# Patient Record
Sex: Female | Born: 1955
Health system: Southern US, Community
[De-identification: ages and names within clinical notes are randomized; demographics above are authoritative.]

## PROBLEM LIST (undated history)

## (undated) DIAGNOSIS — E079 Disorder of thyroid, unspecified: Secondary | ICD-10-CM

## (undated) HISTORY — PX: APPENDECTOMY: SHX54

## (undated) HISTORY — DX: Disorder of thyroid, unspecified: E07.9

## (undated) HISTORY — PX: JOINT REPLACEMENT: SHX530

## (undated) HISTORY — PX: THYROIDECTOMY: SHX17

---

## 2018-07-22 HISTORY — PX: CERVICAL FUSION: SHX112

## 2018-07-22 HISTORY — PX: LUMBAR SPINE SURGERY: SHX701

## 2019-08-09 ENCOUNTER — Telehealth: Payer: Self-pay

## 2019-08-09 NOTE — Telephone Encounter (Signed)

## 2019-08-10 ENCOUNTER — Other Ambulatory Visit: Payer: Self-pay

## 2019-08-10 ENCOUNTER — Ambulatory Visit (INDEPENDENT_AMBULATORY_CARE_PROVIDER_SITE_OTHER): Payer: Medicare Other | Admitting: Internal Medicine

## 2019-08-10 ENCOUNTER — Encounter: Payer: Self-pay | Admitting: Internal Medicine

## 2019-08-10 VITALS — BP 116/74 | HR 51 | Temp 98.1°F | Resp 12 | Ht 60.0 in | Wt 123.0 lb

## 2019-08-10 DIAGNOSIS — Z23 Encounter for immunization: Secondary | ICD-10-CM

## 2019-08-10 DIAGNOSIS — E785 Hyperlipidemia, unspecified: Secondary | ICD-10-CM | POA: Insufficient documentation

## 2019-08-10 DIAGNOSIS — E039 Hypothyroidism, unspecified: Secondary | ICD-10-CM

## 2019-08-10 DIAGNOSIS — Z114 Encounter for screening for human immunodeficiency virus [HIV]: Secondary | ICD-10-CM

## 2019-08-10 DIAGNOSIS — Z1159 Encounter for screening for other viral diseases: Secondary | ICD-10-CM

## 2019-08-10 DIAGNOSIS — Z7689 Persons encountering health services in other specified circumstances: Secondary | ICD-10-CM

## 2019-08-10 NOTE — Patient Instructions (Signed)
Thank you for choosing Primary Care at [facility] to be your medical home!    Tina Bartlett was seen by De Hollingshead, DO today.   Tina Bartlett's primary care provider is Marcy Siren, DO.   For the best care possible, you should try to see Marcy Siren, DO whenever you come to the clinic.   We look forward to seeing you again soon!  If you have any questions about your visit today, please call us at [phone] or feel free to reach your primary care provider via MyChart.

## 2019-08-10 NOTE — Addendum Note (Signed)
Addended by: Heidi Dach on: 08/10/2019 10:44 AM   Modules accepted: Orders

## 2019-08-10 NOTE — Progress Notes (Signed)
  Subjective:    Tina Bartlett - 64 y.o. female MRN [id number]  Date of birth: [birth year]  HPI  Tina Bartlett is to establish care. Patient has a PMH significant for hypothyroidism and high cholesterol. Reports she has been stable on Synthroid 100 mcg since 2009. No fatigue, changes in hair/nails, hot/cold intolerance, or unintended weight changes.     Social History   reports that she has been smoking cigarettes. She has never used smokeless tobacco. She reports previous drug use.   Family History  family history includes Cancer in her mother.     Health Maintenance Due  Topic Date Due  . Hepatitis C Screening  1956/02/27  . HIV Screening  01/23/1972  . TETANUS/TDAP  01/23/1976  . PAP SMEAR-Modifier  01/22/1978     Review of Systems  Constitutional: Negative for chills, fever, malaise/fatigue and weight loss.  HENT: Negative for congestion, ear pain and sore throat.   Eyes: Negative for blurred vision, discharge and redness.  Respiratory: Negative for cough, shortness of breath and wheezing.   Cardiovascular: Negative for chest pain, palpitations and leg swelling.  Gastrointestinal: Negative for abdominal pain, constipation, diarrhea, nausea and vomiting.  Genitourinary: Negative for dysuria, frequency, hematuria and urgency.  Musculoskeletal: Negative for back pain and myalgias.  Skin: Negative for rash.  Neurological: Negative for dizziness, weakness and headaches.  Psychiatric/Behavioral: Negative for depression and suicidal ideas. The patient is not nervous/anxious.   All other systems reviewed and are negative.     Past Medical History: There are no problems to display for this patient.   Medications: reviewed and updated   Objective:   Physical Exam BP 116/74 (BP Location: Right Arm, Patient Position: Sitting, Cuff Size: Normal)   Pulse (!) 51   Temp 98.1 F (36.7 C) (Oral)   Resp 12   Ht 5' (1.524 m)   Wt 123 lb (55.8 kg)   BMI 24.02 kg/m  Physical  Exam  Constitutional: She is oriented to person, place, and time and well-developed, well-nourished, and in no distress. No distress.  HENT:  Head: Normocephalic and atraumatic.  Eyes: Conjunctivae and EOM are normal.  Cardiovascular: Normal rate, regular rhythm and normal heart sounds.  No murmur heard. Pulmonary/Chest: Effort normal and breath sounds normal. No respiratory distress.  Musculoskeletal:        General: Normal range of motion.  Neurological: She is alert and oriented to person, place, and time.  Skin: Skin is warm and dry. She is not diaphoretic.  Psychiatric: Affect and judgment normal.        Assessment & Plan:    1. Encounter to establish care  2. Hypothyroidism, unspecified type  Continue Synthroid 100 mcg. Adjust dose as needed based on TSH result.  - TSH  3. Hyperlipidemia, unspecified hyperlipidemia type Continue Simvastatin 40 mg.  - Lipid Panel  4. Need for hepatitis C screening test - Hepatitis C Antibody  5. Screening for HIV (human immunodeficiency virus) - HIV antibody (with reflex)     Marcy Siren, D.O. 08/10/2019, 9:29 AM Primary Care at [facility]

## 2019-08-10 NOTE — Progress Notes (Signed)
Recent surgery on back in September

## 2019-08-11 LAB — LIPID PANEL
Chol/HDL Ratio: 3 ratio (ref 0.0–4.4)
Cholesterol, Total: 172 mg/dL (ref 100–199)
HDL: 57 mg/dL (ref >39)
LDL Chol Calc (NIH): 94 mg/dL (ref 0–99)
Triglycerides: 121 mg/dL (ref 0–149)
VLDL Cholesterol Cal: 21 mg/dL (ref 5–40)

## 2019-08-11 LAB — HIV ANTIBODY (ROUTINE TESTING W REFLEX): HIV Screen 4th Generation wRfx: NONREACTIVE

## 2019-08-11 LAB — TSH: TSH: 0.757 u[IU]/mL (ref 0.450–4.500)

## 2019-08-11 LAB — HEPATITIS C ANTIBODY: Hep C Virus Ab: <0.1 s/co ratio (ref 0.0–0.9)

## 2019-08-16 ENCOUNTER — Telehealth: Payer: Self-pay

## 2019-08-16 ENCOUNTER — Other Ambulatory Visit: Payer: Self-pay | Admitting: Internal Medicine

## 2019-08-16 DIAGNOSIS — N951 Menopausal and female climacteric states: Secondary | ICD-10-CM

## 2019-08-16 MED ORDER — PAROXETINE MESYLATE 7.5 MG PO CAPS: 1.0000 | ORAL_CAPSULE | Freq: Every day | ORAL | 11 refills | Status: DC

## 2019-08-16 NOTE — Progress Notes (Signed)
I have prescribed a low dose of Paroxetine for her hot flashes. This is a medicine that is used for depression and anxiety in higher doses but has FDA approval to treat hot flashes. Given that her age is >6, I would recommend against any type of hormonal treatment given increased risk of heart disease, blood clots, and cancer.   Marcy Siren, D.O. Primary Care at [facility]  08/16/2019, 11:01 AM

## 2019-08-16 NOTE — Telephone Encounter (Signed)
Patient called in. She wants to know if she can be prescribed something for hot flashes. Says that she's been having them since she was 41 & initially going through menopause. She has tried OTC medications with no relief.  Premarin is listed on her medication list but she doesn't recall getting that medication from the pharmacy recently.

## 2019-08-16 NOTE — Progress Notes (Signed)
Patient notified of Rx being sent to pharmacy.

## 2019-08-16 NOTE — Progress Notes (Signed)
Patient notified of results & recommendations. Expressed understanding.

## 2019-08-24 ENCOUNTER — Other Ambulatory Visit: Payer: Self-pay | Admitting: Internal Medicine

## 2019-08-24 ENCOUNTER — Telehealth: Payer: Self-pay | Admitting: Internal Medicine

## 2019-08-24 DIAGNOSIS — N951 Menopausal and female climacteric states: Secondary | ICD-10-CM

## 2019-08-24 MED ORDER — PAROXETINE HCL 20 MG PO TABS: 20.0000 mg | ORAL_TABLET | Freq: Every day | ORAL | 3 refills | Status: DC

## 2019-08-24 NOTE — Progress Notes (Signed)
Patient notified of increased dose.

## 2019-08-24 NOTE — Telephone Encounter (Signed)
Please advise.  Patient has been on new Rx for about 1 week.

## 2019-08-24 NOTE — Telephone Encounter (Signed)
For Dr Earlene Plater: Patient called and said that her medication for hot flashes isn't doing anything. Would like to know if she can do something different.   Please contact patient in regards to medication management

## 2019-08-24 NOTE — Progress Notes (Signed)
I have sent in a higher dose of Paxil to see if that helps. Other options are trying a different SSRI/SNRI as sometimes they work differently for different people. If the higher dose does not help with her hot flashes she will need to make an appointment to discuss further. Again, since her age is >60 it is recommended that we do not use any type of hormonal treatment given increased risk of heart disease, blood clots, and cancer.   Marcy Siren, D.O. Primary Care at [facility]  08/24/2019, 10:52 AM

## 2019-08-30 ENCOUNTER — Other Ambulatory Visit: Payer: Self-pay | Admitting: Internal Medicine

## 2019-08-30 MED ORDER — LUBIPROSTONE 24 MCG PO CAPS: 24.0000 ug | ORAL_CAPSULE | Freq: Two times a day (BID) | ORAL | 3 refills | Status: DC

## 2019-08-30 NOTE — Telephone Encounter (Signed)
Would be initial Rx under your name.

## 2019-08-30 NOTE — Telephone Encounter (Signed)
Who previously prescribed Amitiza and for what indication? I don't see any records in EMR. Would need further information and potentially records prior to giving Rx.   Marcy Siren, D.O. Primary Care at [facility]  08/30/2019, 11:17 AM

## 2019-08-30 NOTE — Telephone Encounter (Signed)
Pt called asking for Amitiza 24 mg

## 2019-08-30 NOTE — Telephone Encounter (Signed)
Her previous PCP in [place] wrote it for chronic constipation.

## 2019-08-30 NOTE — Progress Notes (Signed)
Amitiza reordered.   Marcy Siren, D.O. Primary Care at [facility]  08/30/2019, 12:03 PM

## 2019-09-24 ENCOUNTER — Ambulatory Visit: Payer: Medicare Other | Attending: Internal Medicine

## 2019-09-24 DIAGNOSIS — Z23 Encounter for immunization: Secondary | ICD-10-CM

## 2019-09-24 NOTE — Progress Notes (Signed)
   Covid-19 Vaccination Clinic  Name:  Tina Bartlett    MRN: [id number] DOB: 1956/03/29  09/24/2019  Tina Bartlett was observed post Covid-19 immunization for 15 minutes without incident. She was provided with Vaccine Information Sheet and instruction to access the V-Safe system.   Tina Bartlett was instructed to call 911 with any severe reactions post vaccine: Marland Kitchen Difficulty breathing  . Swelling of face and throat  . A fast heartbeat  . A bad rash all over body  . Dizziness and weakness   Immunizations Administered    Name Date Dose VIS Date Route   Pfizer COVID-19 Vaccine 09/24/2019  1:15 PM 0.3 mL 07/02/2019 Intramuscular   Manufacturer: [employer], [employer]   Lot: [id number]   NDC: [address]-7

## 2019-10-04 ENCOUNTER — Other Ambulatory Visit: Payer: Self-pay | Admitting: Internal Medicine

## 2019-10-04 NOTE — Telephone Encounter (Signed)
1) Medication(s) Requested (by name): baclofen (LIORESAL) 20 MG tablet [[id number]]   2) Pharmacy of Choice:  [id number] - [place], [place] - [address]  [address], [place] [address]    Approved medications will be sent to pharmacy, we will reach out to you if there is an issue.  Requests made after 3pm may not be addressed until following business day!

## 2019-10-04 NOTE — Telephone Encounter (Signed)
Refill request.  Patient was on medication from previous PCP in [place].

## 2019-10-07 ENCOUNTER — Telehealth: Payer: Self-pay | Admitting: Internal Medicine

## 2019-10-07 MED ORDER — BACLOFEN 20 MG PO TABS: 20.0000 mg | ORAL_TABLET | Freq: Every day | ORAL | 0 refills | Status: DC

## 2019-10-07 NOTE — Telephone Encounter (Signed)
Baclofen has already been sent to Dr. Alvis Lemmings to prescribe. This is a new Rx that hasn't been written by one of our providers before so I am unable to refill.  Our providers do not write Xanax .

## 2019-10-07 NOTE — Telephone Encounter (Signed)
1) Medication(s) Requested (by name): baclofen (LIORESAL) 20 MG tablet [[id number]]  ALPRAZolam (XANAX) 1 MG tablet [[id number]]  2) Pharmacy of [facility]: [id number] [place], [place] - [address]  [address], [place] [address]     Approved medications will be sent to pharmacy, we will reach out to you if there is an issue.  Requests made after 3pm may not be addressed until following business day!

## 2019-10-12 NOTE — Telephone Encounter (Signed)
Patient called again asking about her Xanax. Patient was informed that her PCP does not prescribed that medication and patient stated that she needs the medication because of her anxiety. Patient would like to know if she can be prescribed something for her anxiety. Please f/u.

## 2019-10-13 ENCOUNTER — Other Ambulatory Visit: Payer: Self-pay | Admitting: Internal Medicine

## 2019-10-13 DIAGNOSIS — F419 Anxiety disorder, unspecified: Secondary | ICD-10-CM

## 2019-10-13 MED ORDER — HYDROXYZINE PAMOATE 25 MG PO CAPS: 25.0000 mg | ORAL_CAPSULE | Freq: Three times a day (TID) | ORAL | 0 refills | Status: DC | PRN

## 2019-10-13 NOTE — Telephone Encounter (Signed)
Please advise on patient's request for something in place of Xanax for her anxiety.

## 2019-10-13 NOTE — Progress Notes (Signed)
Please schedule phone visit to discuss patient's concerns with anxiety.

## 2019-10-13 NOTE — Progress Notes (Signed)
I have sent in hydroxyzine prn anxiety. Anxiety is not listed on patient's problem list and she did not bring this up during our visit to establish care. I would recommend that she has a visit set up to discuss this history and her symptoms related to anxiety.   Marcy Siren, D.O. Primary Care at [facility]  10/13/2019, 9:41 AM

## 2019-10-20 ENCOUNTER — Ambulatory Visit: Payer: Medicare Other | Attending: Internal Medicine

## 2019-10-20 DIAGNOSIS — Z23 Encounter for immunization: Secondary | ICD-10-CM

## 2019-10-20 NOTE — Progress Notes (Signed)
   Covid-19 Vaccination Clinic  Name:  Tina Bartlett    MRN: [id number] DOB: Oct 15, 1955  10/20/2019  [name] was observed post Covid-19 immunization for 15 minutes without incident. She was provided with Vaccine Information Sheet and instruction to access the V-Safe system.   [name] was instructed to call 911 with any severe reactions post vaccine: Marland Kitchen Difficulty breathing  . Swelling of face and throat  . A fast heartbeat  . A bad rash all over body  . Dizziness and weakness   Immunizations Administered    Name Date Dose VIS Date Route   Pfizer COVID-19 Vaccine 10/20/2019 11:34 AM 0.3 mL 07/02/2019 Intramuscular   Manufacturer: [employer], [employer]   Lot: [id number]   NDC: [address]-3

## 2019-11-11 ENCOUNTER — Other Ambulatory Visit: Payer: Self-pay | Admitting: Internal Medicine

## 2019-11-11 NOTE — Telephone Encounter (Signed)
1) Medication(s) Requested (by name):gabapentin (NEURONTIN) 600 MG tablet [[id number]]  baclofen (LIORESAL) 20 MG tablet [[id number]   2) [phone] [place], [place] - [address]  [address], [place] [address]   3) Special Requests:   Approved medications will be sent to the pharmacy, we will reach out if there is an issue.  Requests made after 3pm may not be addressed until the following business day!  If a patient is unsure of the name of the medication(s) please note and ask patient to call back when they are able to provide all info, do not send to responsible party until all information is available!

## 2019-11-15 MED ORDER — GABAPENTIN 600 MG PO TABS: 600.0000 mg | ORAL_TABLET | Freq: Three times a day (TID) | ORAL | 0 refills | Status: DC

## 2019-11-15 MED ORDER — BACLOFEN 20 MG PO TABS: 20.0000 mg | ORAL_TABLET | Freq: Every day | ORAL | 0 refills | Status: DC

## 2019-11-15 NOTE — Telephone Encounter (Signed)
1) Medication(s) Requested (by name):levothyroxine (SYNTHROID) 100 MCG tablet [[id number]]  baclofen (LIORESAL) 20 MG tablet [[id number] gabapentin (NEURONTIN) 600 MG tablet [[id number]]    2) [id number] [place], [place] - [address]  [address], [place] [address]   3) Special Requests:   Approved medications will be sent to the pharmacy, we will reach out if there is an issue.  Requests made after 3pm may not be addressed until the following business day!  If a patient is unsure of the name of the medication(s) please note and ask patient to call back when they are able to provide all info, do not send to responsible party until all information is available!

## 2019-11-23 ENCOUNTER — Other Ambulatory Visit: Payer: Self-pay

## 2019-11-23 DIAGNOSIS — N951 Menopausal and female climacteric states: Secondary | ICD-10-CM

## 2019-11-23 MED ORDER — SIMVASTATIN 40 MG PO TABS: 40.0000 mg | ORAL_TABLET | Freq: Every day | ORAL | 1 refills | Status: DC

## 2019-11-23 MED ORDER — PAROXETINE HCL 20 MG PO TABS: 20.0000 mg | ORAL_TABLET | Freq: Every day | ORAL | 1 refills | Status: DC

## 2019-11-23 MED ORDER — PANTOPRAZOLE SODIUM 40 MG PO TBEC: 40.0000 mg | DELAYED_RELEASE_TABLET | Freq: Every day | ORAL | 1 refills | Status: DC

## 2019-11-23 MED ORDER — LEVOTHYROXINE SODIUM 100 MCG PO TABS: 100.0000 ug | ORAL_TABLET | Freq: Every day | ORAL | 1 refills | Status: DC

## 2020-01-06 ENCOUNTER — Other Ambulatory Visit: Payer: Self-pay | Admitting: Internal Medicine

## 2020-01-06 NOTE — Telephone Encounter (Signed)
1) Medication(s) Requested (by name):gabapentin (NEURONTIN) 600 MG tablet [[id number]]  [id number] [place], [place] - [address]  [address], [place] [address]  2) Pharmacy of Choice:  3) Special Requests:   Approved medications will be sent to the pharmacy, we will reach out if there is an issue.  Requests made after 3pm may not be addressed until the following business day!  If a patient is unsure of the name of the medication(s) please note and ask patient to call back when they are able to provide all info, do not send to responsible party until all information is available!

## 2020-01-07 MED ORDER — GABAPENTIN 600 MG PO TABS: 600.0000 mg | ORAL_TABLET | Freq: Three times a day (TID) | ORAL | 2 refills | Status: DC

## 2020-01-07 MED ORDER — LUBIPROSTONE 24 MCG PO CAPS: 24.0000 ug | ORAL_CAPSULE | Freq: Two times a day (BID) | ORAL | 3 refills | Status: DC

## 2020-02-08 ENCOUNTER — Encounter: Payer: Self-pay | Admitting: Internal Medicine

## 2020-02-08 ENCOUNTER — Telehealth (INDEPENDENT_AMBULATORY_CARE_PROVIDER_SITE_OTHER): Payer: Medicare Other | Admitting: Internal Medicine

## 2020-02-08 DIAGNOSIS — Z13228 Encounter for screening for other metabolic disorders: Secondary | ICD-10-CM | POA: Diagnosis not present

## 2020-02-08 DIAGNOSIS — E785 Hyperlipidemia, unspecified: Secondary | ICD-10-CM

## 2020-02-08 DIAGNOSIS — E039 Hypothyroidism, unspecified: Secondary | ICD-10-CM

## 2020-02-08 DIAGNOSIS — G4700 Insomnia, unspecified: Secondary | ICD-10-CM

## 2020-02-08 MED ORDER — TRAZODONE HCL 50 MG PO TABS: 25.0000 mg | ORAL_TABLET | Freq: Every evening | ORAL | 3 refills | Status: DC | PRN

## 2020-02-08 NOTE — Progress Notes (Signed)
Virtual Visit via Telephone Note  I connected with Tina Bartlett, on 02/08/2020 at 10:33 AM by telephone due to the COVID-19 pandemic and verified that I am speaking with the correct person using two identifiers.   Consent: I discussed the limitations, risks, security and privacy concerns of performing an evaluation and management service by telephone and the availability of in person appointments. I also discussed with the patient that there may be a patient responsible charge related to this service. The patient expressed understanding and agreed to proceed.   Location of Patient: Home   Location of Provider: Clinic    Persons participating in Telemedicine visit: Kacelyn Mccuistion Charolotte Eke Dr. Earlene Plater   History of Present Illness: Patient has a visit to follow up on chronic medical conditions. Also has concerns about insomnia. Requests refill for Valium that she previously took to help her sleep. I have prescribed Hydroxyzine in the past for anxiety/sleep and she states this does not help.    Past Medical History:  Diagnosis Date  . Thyroid disease    No Known Allergies  Current Outpatient Medications on File Prior to Visit  Medication Sig Dispense Refill  . baclofen (LIORESAL) 20 MG tablet Take 1 tablet (20 mg total) by mouth daily. 30 each 0  . cholecalciferol (VITAMIN D3) 25 MCG (1000 UNIT) tablet Take 1,000 Units by mouth daily.    . diazepam (VALIUM) 5 MG tablet Take 5 mg by mouth every 6 (six) hours as needed for anxiety.    . gabapentin (NEURONTIN) 600 MG tablet Take 1 tablet (600 mg total) by mouth 3 (three) times daily. 90 tablet 2  . hydrOXYzine (VISTARIL) 25 MG capsule Take 1 capsule (25 mg total) by mouth 3 (three) times daily as needed for anxiety. 30 capsule 0  . levothyroxine (SYNTHROID) 100 MCG tablet Take 1 tablet (100 mcg total) by mouth daily. 90 tablet 1  . lubiprostone (AMITIZA) 24 MCG capsule Take 1 capsule (24 mcg total) by mouth 2 (two) times daily  with a meal. 60 capsule 3  . pantoprazole (PROTONIX) 40 MG tablet Take 1 tablet (40 mg total) by mouth daily. 90 tablet 1  . PARoxetine (PAXIL) 20 MG tablet Take 1 tablet (20 mg total) by mouth daily. 90 tablet 1  . PREMARIN 0.3 MG tablet Take 0.3 mg by mouth daily.    . simvastatin (ZOCOR) 40 MG tablet Take 1 tablet (40 mg total) by mouth daily. 90 tablet 1   No current facility-administered medications on file prior to visit.    Observations/Objective: NAD. Speaking clearly.  Work of breathing normal.  Alert and oriented. Mood appropriate.   Assessment and Plan: 1. Hypothyroidism, unspecified type No fatigue, changes in hair/nails, hot/cold intolerance, or unintended weight changes. Last TSH stable in Jan. Will repeat. Continue Synthroid 100 mcg.  - TSH+T4F+T3Free; Future  2. Hyperlipidemia, unspecified hyperlipidemia type Last LDL 94 in Jan 2021. Continue Simvastatin 40 mg.   3. Screening for metabolic disorder - CBC with Differential; Future - Comprehensive metabolic panel; Future  4. Insomnia, unspecified type Discussed that use of benzodiazepines as sleep aid is not indicated and that should not be a medication prescribed long term. Will attempt to control insomnia with Trazodone. Discussed sleep hygiene tactics.  - traZODone (DESYREL) 50 MG tablet; Take 0.5-1 tablets (25-50 mg total) by mouth at bedtime as needed for sleep.  Dispense: 30 tablet; Refill: 3   Follow Up Instructions: Lab visit    I discussed the assessment and treatment plan  with the patient. The patient was provided an opportunity to ask questions and all were answered. The patient agreed with the plan and demonstrated an understanding of the instructions.   The patient was advised to call back or seek an in-person evaluation if the symptoms worsen or if the condition fails to improve as anticipated.     I provided 18 minutes total of non-face-to-face time during this encounter including median  intraservice time, reviewing previous notes, investigations, ordering medications, medical decision making, coordinating care and patient verbalized understanding at the end of the visit.    Marcy Siren, D.O. Primary Care at [facility]  02/08/2020, 10:33 AM

## 2020-02-17 ENCOUNTER — Telehealth: Payer: Medicare Other | Admitting: Internal Medicine

## 2020-02-21 ENCOUNTER — Ambulatory Visit (INDEPENDENT_AMBULATORY_CARE_PROVIDER_SITE_OTHER): Payer: Medicare HMO | Admitting: Internal Medicine

## 2020-02-21 ENCOUNTER — Encounter: Payer: Self-pay | Admitting: Internal Medicine

## 2020-02-21 ENCOUNTER — Ambulatory Visit (INDEPENDENT_AMBULATORY_CARE_PROVIDER_SITE_OTHER): Payer: Medicare HMO

## 2020-02-21 VITALS — BP 125/82 | HR 61 | Temp 97.3°F | Resp 17 | Wt 130.0 lb

## 2020-02-21 DIAGNOSIS — Z13228 Encounter for screening for other metabolic disorders: Secondary | ICD-10-CM | POA: Diagnosis not present

## 2020-02-21 DIAGNOSIS — E039 Hypothyroidism, unspecified: Secondary | ICD-10-CM

## 2020-02-21 DIAGNOSIS — S4991XA Unspecified injury of right shoulder and upper arm, initial encounter: Secondary | ICD-10-CM | POA: Diagnosis not present

## 2020-02-21 DIAGNOSIS — W19XXXA Unspecified fall, initial encounter: Secondary | ICD-10-CM | POA: Diagnosis not present

## 2020-02-21 DIAGNOSIS — M25511 Pain in right shoulder: Secondary | ICD-10-CM

## 2020-02-21 DIAGNOSIS — M19011 Primary osteoarthritis, right shoulder: Secondary | ICD-10-CM | POA: Diagnosis not present

## 2020-02-21 MED ORDER — LIDOCAINE 5 % EX PTCH: 1.0000 | MEDICATED_PATCH | CUTANEOUS | 1 refills | Status: DC

## 2020-02-21 NOTE — Progress Notes (Signed)
  Subjective:    Tina Bartlett - 64 y.o. female MRN [id number]  Date of birth: 05-Apr-1956  HPI  Tina Bartlett is here for visit for a recent fall. Having right shoulder pain since falling letting her dog out. Larey Seat a little less than a week ago. Has an ache inside her shoulder. She had some bruising after the initial injury but it has since resolved. Asks for lidocaine patch---she used to use these in [place] for pain control. She found one at home and it helped relieve her pain a lot.   Health Maintenance:  Health Maintenance Due  Topic Date Due  . TETANUS/TDAP  Never done  . PAP SMEAR-Modifier  Never done  . INFLUENZA VACCINE  02/20/2020    -  reports that she has been smoking cigarettes. She has never used smokeless tobacco. - Review of Systems: Per HPI. - Past Medical History: Patient Active Problem List   Diagnosis Date Noted  . Vasomotor symptoms due to menopause 08/16/2019  . Hypothyroidism 08/10/2019  . Hyperlipidemia 08/10/2019   - Medications: reviewed and updated   Objective:   Physical Exam BP 125/82   Pulse 61   Temp (!) 97.3 F (36.3 C) (Temporal)   Resp 17   Wt 130 lb (59 kg)   SpO2 96%   BMI 25.39 kg/m  Physical Exam Constitutional:      General: She is not in acute distress.    Appearance: She is not diaphoretic.  Cardiovascular:     Rate and Rhythm: Normal rate.  Pulmonary:     Effort: Pulmonary effort is normal. No respiratory distress.  Musculoskeletal:     Comments: Right shoulder without bruising, edema, erythema. No obvious joint abnormality. Has some initial limited active ROM in abduction past about 150 degrees but this improves with movement. Passive ROM intact. Pain with internal rotation.   Skin:    General: Skin is warm and dry.  Neurological:     Mental Status: She is alert and oriented to person, place, and time.  Psychiatric:        Mood and Affect: Affect normal.        Judgment: Judgment normal.            Assessment & Plan:    1. Fall, initial encounter  2. Acute pain of right shoulder Will obtain X-ray to ensure no abnormality related to fall. Have prescribed Lidocaine patch for pain relief and instructed on how to use. Given gentle ROM shoulder exercises to start at home. Continue with ice and heat therapy. Return if not improving.  - lidocaine (LIDODERM) 5 %; Place 1 patch onto the skin daily. Remove & Discard patch within 12 hours or as directed by MD  Dispense: 30 patch; Refill: 1 - DG Shoulder Right; Future  3. Hypothyroidism, unspecified type - TSH+T4F+T3Free  4. Screening for metabolic disorder - Comprehensive metabolic panel - CBC with Differential      Marcy Siren, D.O. 02/21/2020, 1:51 PM Primary Care at [facility]

## 2020-02-21 NOTE — Patient Instructions (Signed)
Shoulder Range of Motion Exercises Shoulder range of motion (ROM) exercises are done to keep the shoulder moving freely or to increase movement. They are often recommended for people who have shoulder pain or stiffness or who are recovering from a shoulder surgery. Phase 1 exercises When you are able, do this exercise 1-2 times per day for 30-60 seconds in each direction, or as directed by your health care provider. Pendulum exercise To do this exercise while sitting: 1. Sit in a chair or at the edge of your bed with your feet flat on the floor. 2. Let your affected arm hang down in front of you over the edge of the bed or chair. 3. Relax your shoulder, arm, and hand. 4. Rock your body so your arm gently swings in small circles. You can also use your unaffected arm to start the motion. 5. Repeat changing the direction of the circles, swinging your arm left and right, and swinging your arm forward and back. To do this exercise while standing: 1. Stand next to a sturdy chair or table, and hold on to it with your hand on your unaffected side. 2. Bend forward at the waist. 3. Bend your knees slightly. 4. Relax your shoulder, arm, and hand. 5. While keeping your shoulder relaxed, use body motion to swing your arm in small circles. 6. Repeat changing the direction of the circles, swinging your arm left and right, and swinging your arm forward and back. 7. Between exercises, stand up tall and take a short break to relax your lower back.  Phase 2 exercises Do these exercises 1-2 times per day or as told by your health care provider. Hold each stretch for 30 seconds, and repeat 3 times. Do the exercises with one or both arms as instructed by your health care provider. For these exercises, sit at a table with your hand and arm supported by the table. A chair that slides easily or has wheels can be helpful. External rotation 1. Turn your chair so that your affected side is nearest to the  table. 2. Place your forearm on the table to your side. Bend your elbow about 90 at the elbow (right angle) and place your hand palm facing down on the table. Your elbow should be about 6 inches away from your side. 3. Keeping your arm on the table, lean your body forward. Abduction 1. Turn your chair so that your affected side is nearest to the table. 2. Place your forearm and hand on the table so that your thumb points toward the ceiling and your arm is straight out to your side. 3. Slide your hand out to the side and away from you, using your unaffected arm to do the work. 4. To increase the stretch, you can slide your chair away from the table. Flexion: forward stretch 1. Sit facing the table. Place your hand and elbow on the table in front of you. 2. Slide your hand forward and away from you, using your unaffected arm to do the work. 3. To increase the stretch, you can slide your chair backward. Phase 3 exercises Do these exercises 1-2 times per day or as told by your health care provider. Hold each stretch for 30 seconds, and repeat 3 times. Do the exercises with one or both arms as instructed by your health care provider. Cross-body stretch: posterior capsule stretch 1. Lift your arm straight out in front of you. 2. Bend your arm 90 at the elbow (right angle) so your forearm   moves across your body. 3. Use your other arm to gently pull the elbow across your body, toward your other shoulder. Wall climbs 1. Stand with your affected arm extended out to the side with your hand resting on a door frame. 2. Slide your hand slowly up the door frame. 3. To increase the stretch, step through the door frame. Keep your body upright and do not lean. Wand exercises You will need a cane, a piece of PVC pipe, or a sturdy wooden dowel for wand exercises. Flexion To do this exercise while standing: 1. Hold the wand with both of your hands, palms down. 2. Using the other arm to help, lift your arms  up and over your head, if able. 3. Push upward with your other arm to gently increase the stretch. To do this exercise while lying down: 1. Lie on your back with your elbows resting on the floor and the wand in both your hands. Your hands will be palm down, or pointing toward your feet. 2. Lift your hands toward the ceiling, using your unaffected arm to help if needed. 3. Bring your arms overhead as able, using your unaffected arm to help if needed. Internal rotation 1. Stand while holding the wand behind you with both hands. Your unaffected arm should be extended above your head with the arm of the affected side extended behind you at the level of your waist. The wand should be pointing straight up and down as you hold it. 2. Slowly pull the wand up behind your back by straightening the elbow of your unaffected arm and bending the elbow of your affected arm. External rotation 1. Lie on your back with your affected upper arm supported on a small pillow or rolled towel. When you first do this exercise, keep your upper arm close to your body. Over time, bring your arm up to a 90 angle out to the side. 2. Hold the wand across your stomach and with both hands palm up. Your elbow on your affected side should be bent at a 90 angle. 3. Use your unaffected side to help push your forearm away from you and toward the floor. Keep your elbow on your affected side bent at a 90 angle. Contact a health care provider if you have:  New or increasing pain.  New numbness, tingling, weakness, or discoloration in your arm or hand. This information is not intended to replace advice given to you by your health care provider. Make sure you discuss any questions you have with your health care provider. Document Revised: 08/20/2017 Document Reviewed: 08/20/2017 Elsevier Patient Education  2020 [employer]  

## 2020-02-22 ENCOUNTER — Other Ambulatory Visit: Payer: Self-pay | Admitting: Internal Medicine

## 2020-02-22 ENCOUNTER — Telehealth: Payer: Self-pay | Admitting: Internal Medicine

## 2020-02-22 DIAGNOSIS — R7989 Other specified abnormal findings of blood chemistry: Secondary | ICD-10-CM

## 2020-02-22 LAB — CBC WITH DIFFERENTIAL/PLATELET
Basophils Absolute: 0 10*3/uL (ref 0.0–0.2)
Basos: 1 %
EOS (ABSOLUTE): 0 10*3/uL (ref 0.0–0.4)
Eos: 1 %
Hematocrit: 40.2 % (ref 34.0–46.6)
Hemoglobin: 13.2 g/dL (ref 11.1–15.9)
Immature Grans (Abs): 0 10*3/uL (ref 0.0–0.1)
Immature Granulocytes: 0 %
Lymphocytes Absolute: 2.6 10*3/uL (ref 0.7–3.1)
Lymphs: 44 %
MCH: 30 pg (ref 26.6–33.0)
MCHC: 32.8 g/dL (ref 31.5–35.7)
MCV: 91 fL (ref 79–97)
Monocytes Absolute: 0.5 10*3/uL (ref 0.1–0.9)
Monocytes: 9 %
Neutrophils Absolute: 2.6 10*3/uL (ref 1.4–7.0)
Neutrophils: 45 %
Platelets: 256 10*3/uL (ref 150–450)
RBC: 4.4 x10E6/uL (ref 3.77–5.28)
RDW: 12.3 % (ref 11.7–15.4)
WBC: 5.8 10*3/uL (ref 3.4–10.8)

## 2020-02-22 LAB — COMPREHENSIVE METABOLIC PANEL
ALT: 15 IU/L (ref 0–32)
AST: 23 IU/L (ref 0–40)
Albumin/Globulin Ratio: 2.1 (ref 1.2–2.2)
Albumin: 5.1 g/dL — ABNORMAL HIGH (ref 3.8–4.8)
Alkaline Phosphatase: 92 IU/L (ref 48–121)
BUN/Creatinine Ratio: 12 (ref 12–28)
BUN: 14 mg/dL (ref 8–27)
Bilirubin Total: 0.3 mg/dL (ref 0.0–1.2)
CO2: 24 mmol/L (ref 20–29)
Calcium: 9.9 mg/dL (ref 8.7–10.3)
Chloride: 102 mmol/L (ref 96–106)
Creatinine, Ser: 1.16 mg/dL — ABNORMAL HIGH (ref 0.57–1.00)
GFR calc Af Amer: 58 mL/min/{1.73_m2} — ABNORMAL LOW (ref >59)
GFR calc non Af Amer: 50 mL/min/{1.73_m2} — ABNORMAL LOW (ref >59)
Globulin, Total: 2.4 g/dL (ref 1.5–4.5)
Glucose: 96 mg/dL (ref 65–99)
Potassium: 4.7 mmol/L (ref 3.5–5.2)
Sodium: 143 mmol/L (ref 134–144)
Total Protein: 7.5 g/dL (ref 6.0–8.5)

## 2020-02-22 LAB — TSH+T4F+T3FREE
Free T4: 1.44 ng/dL (ref 0.82–1.77)
T3, Free: 2.5 pg/mL (ref 2.0–4.4)
TSH: 3.11 u[IU]/mL (ref 0.450–4.500)

## 2020-02-22 NOTE — Telephone Encounter (Signed)
Patient called in to inform pcp/nurse that she is needing a prior auth per insurance for listed medication. Please follow up at your earliest convenience.  lidocaine (LIDODERM) 5 % [[id number]]

## 2020-02-23 NOTE — Telephone Encounter (Signed)
Patient called in to check on the status of her patches. Please follow up at your earliest convenience.

## 2020-02-24 NOTE — Telephone Encounter (Signed)
PA was denied. Is there an alternative medication you can send?

## 2020-02-24 NOTE — Telephone Encounter (Signed)
Logged onto [email or web address] to check status of prior authorization. It is still awaiting determination from patient's insurance.

## 2020-02-25 ENCOUNTER — Other Ambulatory Visit: Payer: Self-pay | Admitting: Internal Medicine

## 2020-02-25 MED ORDER — LIDOCAINE 5 % EX OINT: 1.0000 "application " | TOPICAL_OINTMENT | CUTANEOUS | 0 refills | Status: DC | PRN

## 2020-02-25 NOTE — Telephone Encounter (Signed)
I just sent in the ointment version of the same medication. We can see if that gets approval over the patch.   .si

## 2020-02-28 ENCOUNTER — Other Ambulatory Visit: Payer: Medicare Other

## 2020-02-28 NOTE — Telephone Encounter (Signed)
1) Medication(s) Requested (by name):baclofen (LIORESAL) 20 MG tablet [[id number]]    2) [phone] [place], [place] - [address]  [address], [place] [address]   3) Special Requests:   Approved medications will be sent to the pharmacy, we will reach out if there is an issue.  Requests made after 3pm may not be addressed until the following business day!  If a patient is unsure of the name of the medication(s) please note and ask patient to call back when they are able to provide all info, do not send to responsible party until all information is available!

## 2020-03-01 ENCOUNTER — Other Ambulatory Visit: Payer: Self-pay

## 2020-03-01 DIAGNOSIS — N951 Menopausal and female climacteric states: Secondary | ICD-10-CM

## 2020-03-01 MED ORDER — LUBIPROSTONE 24 MCG PO CAPS: 24.0000 ug | ORAL_CAPSULE | Freq: Two times a day (BID) | ORAL | 0 refills | Status: DC

## 2020-03-01 MED ORDER — PAROXETINE HCL 20 MG PO TABS: 20.0000 mg | ORAL_TABLET | Freq: Every day | ORAL | 0 refills | Status: DC

## 2020-03-01 MED ORDER — PANTOPRAZOLE SODIUM 40 MG PO TBEC: 40.0000 mg | DELAYED_RELEASE_TABLET | Freq: Every day | ORAL | 0 refills | Status: DC

## 2020-03-03 NOTE — Progress Notes (Signed)
Patient notified of results & recommendations. Expressed understanding.

## 2020-03-13 ENCOUNTER — Telehealth: Payer: Self-pay | Admitting: Internal Medicine

## 2020-03-13 NOTE — Telephone Encounter (Signed)
Patient called in and stated that she received a vm from the office with the rep asking a lot of questions. The patient also wanted to ask pcp/nurse why she could get any baclofen (LIORESAL) 20 MG tablet [[id number]]. Patient stated that she has been experiencing shoulder pain and wanted something to help with the pain.

## 2020-03-14 ENCOUNTER — Other Ambulatory Visit: Payer: Self-pay

## 2020-03-14 MED ORDER — BACLOFEN 20 MG PO TABS: 20.0000 mg | ORAL_TABLET | Freq: Every day | ORAL | 2 refills | Status: DC

## 2020-03-14 NOTE — Telephone Encounter (Signed)
Sent refill request to provider.  Left voicemail to get more information about the rep who left her a VM.

## 2020-03-21 ENCOUNTER — Telehealth (INDEPENDENT_AMBULATORY_CARE_PROVIDER_SITE_OTHER): Payer: Medicare HMO | Admitting: Internal Medicine

## 2020-03-21 ENCOUNTER — Encounter: Payer: Self-pay | Admitting: Internal Medicine

## 2020-03-21 DIAGNOSIS — M25511 Pain in right shoulder: Secondary | ICD-10-CM | POA: Diagnosis not present

## 2020-03-21 DIAGNOSIS — G4700 Insomnia, unspecified: Secondary | ICD-10-CM

## 2020-03-21 NOTE — Progress Notes (Signed)
Virtual Visit via Telephone Note  I connected with Tina Bartlett, on 03/21/2020 at 2:56 PM by telephone due to the COVID-19 pandemic and verified that I am speaking with the correct person using two identifiers.   Consent: I discussed the limitations, risks, security and privacy concerns of performing an evaluation and management service by telephone and the availability of in person appointments. I also discussed with the patient that there may be a patient responsible charge related to this service. The patient expressed understanding and agreed to proceed.   Location of Patient: Home   Location of Provider: Home    Persons participating in Telemedicine visit: Tina Bartlett Tina Bartlett Dr. Earlene Plater    History of Present Illness:  Patient has a follow up for chronic medical conditions. Reports that the Trazodone caused side effects. Initially she said that it caused hallucinations. From further discussion, sounds like it was actually vivid dreams. Wants to know why she can't take Xanax 1 mg for help with sleep aid.   Is also having worsening of her right shoulder pain. This was exacerbated by a recent fall. An xray was obtained and showed degenerative changes. Patient reports that muscle relaxer and gabapentin are not helping. When bringing up possibility of steroid injection, she asked about a steroid dose pack. Currently working on getting Lidocaine patches approved by insurance.    Past Medical History:  Diagnosis Date  . Thyroid disease    No Known Allergies  Current Outpatient Medications on File Prior to Visit  Medication Sig Dispense Refill  . baclofen (LIORESAL) 20 MG tablet Take 1 tablet (20 mg total) by mouth daily. 30 each 2  . cholecalciferol (VITAMIN D3) 25 MCG (1000 UNIT) tablet Take 1,000 Units by mouth daily.    Marland Kitchen gabapentin (NEURONTIN) 600 MG tablet Take 1 tablet (600 mg total) by mouth 3 (three) times daily. 90 tablet 2  . hydrOXYzine (VISTARIL) 25 MG  capsule Take 1 capsule (25 mg total) by mouth 3 (three) times daily as needed for anxiety. 30 capsule 0  . levothyroxine (SYNTHROID) 100 MCG tablet Take 1 tablet (100 mcg total) by mouth daily. 90 tablet 1  . lidocaine (XYLOCAINE) 5 % ointment Apply 1 application topically as needed. 35.44 g 0  . lubiprostone (AMITIZA) 24 MCG capsule Take 1 capsule (24 mcg total) by mouth 2 (two) times daily with a meal. 180 capsule 0  . pantoprazole (PROTONIX) 40 MG tablet Take 1 tablet (40 mg total) by mouth daily. 90 tablet 0  . PARoxetine (PAXIL) 20 MG tablet Take 1 tablet (20 mg total) by mouth daily. 90 tablet 0  . PREMARIN 0.3 MG tablet Take 0.3 mg by mouth daily.    . simvastatin (ZOCOR) 40 MG tablet Take 1 tablet (40 mg total) by mouth daily. 90 tablet 1  . traZODone (DESYREL) 50 MG tablet Take 0.5-1 tablets (25-50 mg total) by mouth at bedtime as needed for sleep. 30 tablet 3   No current facility-administered medications on file prior to visit.    Observations/Objective: NAD. Speaking clearly.  Work of breathing normal.  Alert and oriented. Mood appropriate.   Assessment and Plan: 1. Insomnia, unspecified type Patient discontinued Trazodone due to side effects. At first, was concerned for serotonin syndrome with report of hallucinations especially given use with Paxil. However, seems that she was experiencing vivid dreams instead of true hallucinations. Will continue to manage with sleep hygiene tactics for now.   2. Right shoulder pain, unspecified chronicity Xray showed mild AC  joint degenerative change and mild glenohumeral degenerative change. Lidocaine approval for pain management is pending. We discussed that joint injection may be beneficial and would like to trial that instead of systemic steroids due to risk of using PO steroids. Patient agreeable for [facility] referral for evaluation for injection.  - Ambulatory referral to Sports Medicine   Follow Up Instructions: PRN and for routine  medical care    I discussed the assessment and treatment plan with the patient. The patient was provided an opportunity to ask questions and all were answered. The patient agreed with the plan and demonstrated an understanding of the instructions.   The patient was advised to call back or seek an in-person evaluation if the symptoms worsen or if the condition fails to improve as anticipated.     I provided 22 minutes total of non-face-to-face time during this encounter including median intraservice time, reviewing previous notes, investigations, ordering medications, medical decision making, coordinating care and patient verbalized understanding at the end of the visit.    Marcy Siren, D.O. Primary Care at [facility]  03/21/2020, 2:56 PM

## 2020-04-24 ENCOUNTER — Encounter: Payer: Self-pay | Admitting: Physical Medicine and Rehabilitation

## 2020-04-24 ENCOUNTER — Ambulatory Visit: Payer: Self-pay

## 2020-04-24 ENCOUNTER — Other Ambulatory Visit: Payer: Self-pay

## 2020-04-24 ENCOUNTER — Ambulatory Visit (INDEPENDENT_AMBULATORY_CARE_PROVIDER_SITE_OTHER): Payer: Medicare HMO | Admitting: Physical Medicine and Rehabilitation

## 2020-04-24 DIAGNOSIS — G8929 Other chronic pain: Secondary | ICD-10-CM

## 2020-04-24 DIAGNOSIS — M25511 Pain in right shoulder: Secondary | ICD-10-CM

## 2020-04-24 DIAGNOSIS — Z981 Arthrodesis status: Secondary | ICD-10-CM | POA: Diagnosis not present

## 2020-04-24 DIAGNOSIS — M5412 Radiculopathy, cervical region: Secondary | ICD-10-CM | POA: Diagnosis not present

## 2020-04-24 DIAGNOSIS — M961 Postlaminectomy syndrome, not elsewhere classified: Secondary | ICD-10-CM

## 2020-04-24 MED ORDER — TRIAMCINOLONE ACETONIDE 40 MG/ML IJ SUSP
60.0000 mg | INTRAMUSCULAR | Status: AC | PRN
  Administered 2020-04-24: 60 mg via INTRA_ARTICULAR

## 2020-04-24 MED ORDER — BUPIVACAINE HCL 0.5 % IJ SOLN
3.0000 mL | INTRAMUSCULAR | Status: AC | PRN
  Administered 2020-04-24: 3 mL via INTRA_ARTICULAR

## 2020-04-24 NOTE — Progress Notes (Signed)
Tina Bartlett - 64 y.o. female MRN [id number]  Date of birth: Dec 31, 1955  Office Visit Note: Visit Date: 04/24/2020 PCP: Arvilla Market, DO Referred by: Leary Roca*  Subjective: Chief Complaint  Patient presents with  . Right Shoulder - Pain   HPI: Tina Bartlett is a 65 y.o. female who comes in today For new patient evaluation and management of worsening severe right shoulder and arm pain at the request of Dr. Marcy Siren.  The patient moved from [place] to the area prior to January of this year.  She established care with Dr. Earlene Plater.  In August she reports having had a fall which was a ground-level fall and right shoulder pain since that time with referral down the arm.  Dr. Earlene Plater obtained x-ray of the right shoulder which is reviewed today with images and models.  Report was reviewed.  This did not show any fractures.  This did show some degenerative change of the glenohumeral joint.  Patient has had a more complicated history than I expected when seeing her.  She reports remotely having lumbar surgery that did well initially.  Prior to that surgery she had injections that were not very beneficial.  Then in 2019 in 2020 she had an episode where she says she just could simply not walk and not really move her legs without a lot of pain.  She reports a lot of twitching and muscle issue at the time.  She reports the pain was quite significant.  She reported initial prednisone pills seem to help and then she had she had continued symptoms after that and ultimately underwent a lumbar surgery at [facility] in [place].  Unfortunately I do not have those notes for review.  The patient does not have those notes with her at all.  She reports that the surgery itself did not seem to help very much and then she began having right shoulder and arm symptoms with twitching in the hand.  She reports at that time she underwent a anterior neck surgery and she is really unsure what  she had.  I did show her the x-rays today of the right shoulder and this did show the cervical spine and she does have an anterior or ventral plate from an ACDF.  This appears to be C5-6.  She really was unsure that she had a plate at all.  She reports some relief after that surgery but again still having a lot of symptoms in the right shoulder and arm.  She went on to have oral prednisone for a while that really seemed to help her quite a bit.  And evidently this would give her relief for 3 months or so at a time.  She rates her pain as a 10 out of 10 it does refer into the forearm.  No real numbness and tingling in the hands although some feeling in the hand both weakness at times.  Is worse with sleeping and lifting the arm.  She gets an aching sensation in the right shoulder.  No left-sided complaints.  Really not endorsing much in the way of neck pain.  She continues to have some back and hip pain.  Her case is complicated by bilateral total hip replacements as well.  She is currently on baclofen as well as gabapentin.  She has had pain medication in the past but does not really like pain medications at all.  She has not had recent physical therapy for the shoulder or neck.  She has had that in the past.  She is not reporting any balance difficulties or associated headaches or fevers chills or night sweats etc.  Review of Systems  Musculoskeletal: Positive for back pain, joint pain and neck pain.  All other systems reviewed and are negative.  Otherwise per HPI.  Assessment & Plan: Visit Diagnoses:  1. Chronic right shoulder pain   2. Cervical radiculopathy   3. S/P cervical spinal fusion   4. Post laminectomy syndrome     Plan: Findings:  1.  Right shoulder pain severe worse with laying and lifting significantly worse after fall.  No fractures on x-ray.  Case very complicated because of prior anterior cervical discectomy and fusion that she really was not aware of exactly what she had done.  He  has not responded to medication management.  This seems to be related more to the shoulder itself.  Exam is more impingement signs with internal rotation and external rotation.  She has difficulty with full range of motion with abduction.  She has no focal weakness.  We unfortunately do not have all the records of the surgery or any MRIs [place] that were performed of the cervical spine.  Fortunately she is headed back to [place] and can pick up records at [facility].  Have given her my card to pick up those records and have them sent to [place].  It would be nice to see exactly what she had done last year.  Today we completed intra-articular right glenohumeral joint injection with fluoroscopic guidance.  As noted on the results she had excellent relief during the anesthetic phase.  Depending on how long that lasts and how well she does I would probably have her see Dr. Burnard Bunting in our office from the standpoint of her shoulder itself.  I think he would be able to treat that more efficiently than I would.  I do I reviewed the notes of her neck however and see if there is any thing we can obviously help her with in the future depending on diagnostic relief.  I did offer to provide prednisone for a few days as she has said that it helped in the past.  She does not really like injections but she did agree to do the injection today and it was been patient.  She will continue on baclofen and gabapentin.  Medication is managed by her primary care physician.  2.  No specific complaints today of her back and hips although it is an issue with her.  She has had bilateral hip replacements and low back pain and lumbar surgery.  She is going to get the notes again for [place] to review.  This would open a more of an issue of happy to see her for that but overall her low back and hip she is Status quo at this point.  She did not really understand how the 2 are completely different areas and not related to each other  specifically.  Please note that all procedures performed in the setting of a comprehensive pain management approach with in-house physical therapy and orthopedic surgery and spine surgery as well as access to biopsychosocial counseling if needed.    Meds & Orders: No orders of the defined types were placed in this encounter.   Orders Placed This Encounter  Procedures  . Large Joint Inj  . XR C-ARM NO REPORT    Follow-up: Return for Patient will send [place] medical records from [facility].  Procedures: Large Joint Inj: R glenohumeral on 04/24/2020 12:42 PM Indications: pain and diagnostic evaluation Details: 22 G 3.5 in needle, fluoroscopy-guided anteromedial approach  Arthrogram: No  Medications: 3 mL bupivacaine 0.5 %; 60 mg triamcinolone acetonide 40 MG/ML Outcome: tolerated well, no immediate complications  There was excellent flow of contrast producing a partial arthrogram of the glenohumeral joint. The patient did have relief of symptoms during the anesthetic phase of the injection. Procedure, treatment alternatives, risks and benefits explained, specific risks discussed. Consent was given by the patient. Immediately prior to procedure a time out was called to verify the correct patient, procedure, equipment, support staff and site/side marked as required. Patient was prepped and draped in the usual sterile fashion.      No notes on file   Clinical History: No specialty comments available.   She reports that she has been smoking cigarettes. She has never used smokeless tobacco. No results for input(s): HGBA1C, LABURIC in the last 8760 hours.  Objective:  VS:  HT:    WT:   BMI:     BP:   HR: bpm  TEMP: ( )  RESP:  Physical Exam Vitals and nursing note reviewed.  Constitutional:      General: She is not in acute distress.    Appearance: Normal appearance. She is not ill-appearing.  HENT:     Head: Normocephalic and atraumatic.     Right Ear: External  ear normal.     Left Ear: External ear normal.  Eyes:     Extraocular Movements: Extraocular movements intact.  Cardiovascular:     Rate and Rhythm: Normal rate.     Pulses: Normal pulses.  Musculoskeletal:     Cervical back: Normal range of motion and neck supple. Tenderness present. No rigidity.     Right lower leg: No edema.     Left lower leg: No edema.     Comments: Patient has good strength in the upper extremities including 5 out of 5 strength in wrist extension long finger flexion and APB.  There is no atrophy of the hands intrinsically.  There is a negative Hoffmann's test.  There is a negative Spurling's test bilaterally.  She has some active trigger points in the trapezius levator scapula) right more than left.  She does have impingement of the right shoulder with internal rotation more than external.  She has no crossarm positive test.  No pain over the [facility] joint.   Lymphadenopathy:     Cervical: No cervical adenopathy.  Skin:    Findings: No erythema, lesion or rash.  Neurological:     General: No focal deficit present.     Mental Status: She is alert and oriented to person, place, and time.     Sensory: No sensory deficit.     Motor: No weakness or abnormal muscle tone.     Coordination: Coordination normal.  Psychiatric:        Mood and Affect: Mood normal.        Behavior: Behavior normal.     Ortho Exam  Imaging: XR C-ARM NO REPORT  Result Date: 04/24/2020 Please see Notes tab for imaging impression.   Past Medical/Family/Surgical/Social History: Medications & Allergies reviewed per EMR, new medications updated. Patient Active Problem List   Diagnosis Date Noted  . Vasomotor symptoms due to menopause 08/16/2019  . Hypothyroidism 08/10/2019  . Hyperlipidemia 08/10/2019   Past Medical History:  Diagnosis Date  . Thyroid disease    Family History  Problem Relation  Age of Onset  . Cancer Mother    Past Surgical History:  Procedure Laterality Date  .  APPENDECTOMY     64 years old  . CERVICAL FUSION  2020   C5-6 ACDF  . JOINT REPLACEMENT Bilateral    Hips  . LUMBAR SPINE SURGERY  2020   [facility] [place]  . THYROIDECTOMY     Social History   Occupational History  . Not on file  Tobacco Use  . Smoking status: Light Tobacco Smoker    Types: Cigarettes  . Smokeless tobacco: Never Used  Vaping Use  . Vaping Use: Never used  Substance and Sexual Activity  . Alcohol use: Not on file    Comment: socially  . Drug use: Not Currently  . Sexual activity: Not on file

## 2020-04-24 NOTE — Progress Notes (Signed)
Pt state right shoulder pain. Pt state she has hx of surgery. Pt state sleeping, lifting and it just aching feeling all the time. Pt state pain meds help ease the pain.  Numeric Pain Rating Scale and Functional Assessment Average Pain 10   In the last MONTH (on 0-10 scale) has pain interfered with the following?  1. General activity like being  able to carry out your everyday physical activities such as walking, climbing stairs, carrying groceries, or moving a chair?  Rating(8)

## 2020-05-24 ENCOUNTER — Telehealth: Payer: Self-pay

## 2020-05-24 NOTE — Telephone Encounter (Signed)
1) Medication(s) Requested (by name):gabapentin (NEURONTIN) 600 MG tablet [[id number]]    2) [phone] [place], [place] - [address]  [address], [place] [address]   3) Special Requests:   Approved medications will be sent to the pharmacy, we will reach out if there is an issue.  Requests made after 3pm may not be addressed until the following business day!  If a patient is unsure of the name of the medication(s) please note and ask patient to call back when they are able to provide all info, do not send to responsible party until all information is available!

## 2020-05-25 ENCOUNTER — Telehealth: Payer: Self-pay

## 2020-05-25 ENCOUNTER — Other Ambulatory Visit: Payer: Self-pay | Admitting: Internal Medicine

## 2020-05-25 MED ORDER — GABAPENTIN 600 MG PO TABS: 600.0000 mg | ORAL_TABLET | Freq: Three times a day (TID) | ORAL | 2 refills | Status: DC

## 2020-05-25 NOTE — Telephone Encounter (Signed)
Rx sent.   Marcy Siren, D.O. Primary Care at [facility]  05/25/2020, 1:58 PM

## 2020-05-25 NOTE — Telephone Encounter (Signed)
Patient contacted the office regarding medication refill; requested submitted on 11/3/ by [facility] staff.

## 2020-05-26 ENCOUNTER — Other Ambulatory Visit: Payer: Self-pay

## 2020-05-26 DIAGNOSIS — N951 Menopausal and female climacteric states: Secondary | ICD-10-CM

## 2020-05-26 MED ORDER — SIMVASTATIN 40 MG PO TABS
40.0000 mg | ORAL_TABLET | Freq: Every day | ORAL | 0 refills | Status: DC
Start: 2020-05-26 — End: 2020-08-28

## 2020-05-26 MED ORDER — PAROXETINE HCL 20 MG PO TABS: 20.0000 mg | ORAL_TABLET | Freq: Every day | ORAL | 0 refills | Status: DC

## 2020-05-26 MED ORDER — LEVOTHYROXINE SODIUM 100 MCG PO TABS
100.0000 ug | ORAL_TABLET | Freq: Every day | ORAL | 0 refills | Status: DC
Start: 2020-05-26 — End: 2020-11-22

## 2020-05-26 MED ORDER — PANTOPRAZOLE SODIUM 40 MG PO TBEC
40.0000 mg | DELAYED_RELEASE_TABLET | Freq: Every day | ORAL | 0 refills | Status: DC
Start: 2020-05-26 — End: 2021-01-29

## 2020-05-30 ENCOUNTER — Telehealth (INDEPENDENT_AMBULATORY_CARE_PROVIDER_SITE_OTHER): Payer: Medicare HMO | Admitting: Internal Medicine

## 2020-05-30 ENCOUNTER — Encounter: Payer: Self-pay | Admitting: Internal Medicine

## 2020-05-30 DIAGNOSIS — M25511 Pain in right shoulder: Secondary | ICD-10-CM | POA: Diagnosis not present

## 2020-05-30 DIAGNOSIS — G8929 Other chronic pain: Secondary | ICD-10-CM | POA: Diagnosis not present

## 2020-05-30 DIAGNOSIS — M79601 Pain in right arm: Secondary | ICD-10-CM

## 2020-05-30 NOTE — Progress Notes (Signed)
Virtual Visit via Telephone Note  I connected with Tina Bartlett, on 05/30/2020 at 3:05 PM by telephone due to the COVID-19 pandemic and verified that I am speaking with the correct person using two identifiers.   Consent: I discussed the limitations, risks, security and privacy concerns of performing an evaluation and management service by telephone and the availability of in person appointments. I also discussed with the patient that there may be a patient responsible charge related to this service. The patient expressed understanding and agreed to proceed.   Location of Patient: Home   Location of Provider: Clinic    Persons participating in Telemedicine visit: [name] Charolotte Eke Dr. Earlene Plater    History of Present Illness: Patient has a visit for arm pain. Has had chronic right shoulder pain with degenerative changes. She was seen by orthopedics on 10/4 and given corticosteroid injection. Had temporary relief but pain has now returned. She would like me to make her a f/u appointment with orthopedics on her behalf.    Past Medical History:  Diagnosis Date   Thyroid disease    No Known Allergies  Current Outpatient Medications on File Prior to Visit  Medication Sig Dispense Refill   baclofen (LIORESAL) 20 MG tablet Take 1 tablet (20 mg total) by mouth daily. 30 each 2   cholecalciferol (VITAMIN D3) 25 MCG (1000 UNIT) tablet Take 1,000 Units by mouth daily.     gabapentin (NEURONTIN) 600 MG tablet Take 1 tablet (600 mg total) by mouth 3 (three) times daily. 90 tablet 2   hydrOXYzine (VISTARIL) 25 MG capsule Take 1 capsule (25 mg total) by mouth 3 (three) times daily as needed for anxiety. 30 capsule 0   levothyroxine (SYNTHROID) 100 MCG tablet Take 1 tablet (100 mcg total) by mouth daily. 90 tablet 0   lidocaine (XYLOCAINE) 5 % ointment Apply 1 application topically as needed. 35.44 g 0   lubiprostone (AMITIZA) 24 MCG capsule Take 1 capsule (24 mcg total) by  mouth 2 (two) times daily with a meal. 180 capsule 0   pantoprazole (PROTONIX) 40 MG tablet Take 1 tablet (40 mg total) by mouth daily. 90 tablet 0   PARoxetine (PAXIL) 20 MG tablet Take 1 tablet (20 mg total) by mouth daily. 90 tablet 0   PREMARIN 0.3 MG tablet Take 0.3 mg by mouth daily.     simvastatin (ZOCOR) 40 MG tablet Take 1 tablet (40 mg total) by mouth daily. 90 tablet 0   No current facility-administered medications on file prior to visit.    Observations/Objective: NAD. Speaking clearly.  Work of breathing normal.  Alert and oriented. Mood appropriate.   Assessment and Plan: 1. Chronic right shoulder pain Discussed with patient that she is now an established patient with orthopedics and that she can call to schedule follow up. Number was provided. Discussed that given that corticosteroid injection was done about 5 weeks ago, that likely will be unable to repeat immediately. Encouraged f/u to discuss other methods of pain control and management.      Follow Up Instructions: PRN and for routine medical care    I discussed the assessment and treatment plan with the patient. The patient was provided an opportunity to ask questions and all were answered. The patient agreed with the plan and demonstrated an understanding of the instructions.   The patient was advised to call back or seek an in-person evaluation if the symptoms worsen or if the condition fails to improve as anticipated.  I provided 8 minutes total of non-face-to-face time during this encounter including median intraservice time, reviewing previous notes, investigations, ordering medications, medical decision making, coordinating care and patient verbalized understanding at the end of the visit.    Marcy Siren, D.O. Primary Care at [facility]  05/30/2020, 3:05 PM

## 2020-05-31 ENCOUNTER — Telehealth: Payer: Self-pay

## 2020-05-31 NOTE — Telephone Encounter (Signed)
Patient called about referral for shoulder.

## 2020-06-09 ENCOUNTER — Telehealth: Payer: Self-pay

## 2020-06-09 NOTE — Telephone Encounter (Signed)
Scheduled for OV with Dr. August Saucer.

## 2020-06-09 NOTE — Telephone Encounter (Signed)
If shoulder injection did not help or did help but symptoms have returned I would like her to See Dr. August Saucer in our office for evaluation

## 2020-06-09 NOTE — Telephone Encounter (Signed)
Patient called about her referral she stated she would like to schedule a appointment after thanksgiving. CB:[phone]

## 2020-06-09 NOTE — Telephone Encounter (Signed)
I do not see a new referral. Patient was referred in August for right shoulder injection, and she had this on 10/4. Please advise.

## 2020-06-22 ENCOUNTER — Other Ambulatory Visit: Payer: Self-pay

## 2020-06-22 ENCOUNTER — Encounter: Payer: Self-pay | Admitting: Orthopedic Surgery

## 2020-06-22 ENCOUNTER — Ambulatory Visit (INDEPENDENT_AMBULATORY_CARE_PROVIDER_SITE_OTHER): Payer: Medicare HMO | Admitting: Orthopedic Surgery

## 2020-06-22 DIAGNOSIS — M19011 Primary osteoarthritis, right shoulder: Secondary | ICD-10-CM

## 2020-06-22 DIAGNOSIS — M25511 Pain in right shoulder: Secondary | ICD-10-CM

## 2020-06-25 ENCOUNTER — Encounter: Payer: Self-pay | Admitting: Orthopedic Surgery

## 2020-06-25 NOTE — Progress Notes (Signed)
Office Visit Note   Patient: Tina Bartlett           Date of Birth: Sep 13, 1955           MRN: [id number] Visit Date: 06/22/2020 Requested by: Arvilla Market, DO [address] [place],  [place] [address] PCP: Arvilla Market, DO  Subjective: Chief Complaint  Patient presents with  . Right Shoulder - Pain    HPI: Tina Bartlett is a 64 y.o. female who presents to the office complaining of right shoulder pain.  She has a history of right shoulder pain.  She has received a glenohumeral injection by Dr. Alvester Morin on 04/24/2020 that provided 90% relief for 6 weeks.  She does have a history of lumbar spine and cervical spine surgery.  She also had a mass removed from her low back in 2020.  She notes pain with lifting her arm away from her body.  Pain is waking her up at night and she notes associated grinding symptoms.  Denies any history of dislocation.  Denies any history of surgery to the right shoulder.  She is taking gabapentin with some relief of her pain.  She states that she does not want opioids or any right shoulder surgery at this time.  She cannot take NSAIDs due to a history of GI bleed with anemia and melena..                ROS: All systems reviewed are negative as they relate to the chief complaint within the history of present illness.  Patient denies fevers or chills.  Assessment & Plan: Visit Diagnoses:  1. Primary osteoarthritis, right shoulder   2. Right shoulder pain, unspecified chronicity     Plan: Patient is a 64 year old female presents complaint of right shoulder pain.  She has history of right shoulder osteoarthritis and her last set of radiographs were reviewed today.  Seems most pain is coming from the glenohumeral joint as she had 90% relief for 6 weeks from a cortisone injection administered by Dr. Alvester Morin in October.  She states that she would like to repeat this injection.  Plan to refer patient to Dr. Naaman Plummer for right shoulder glenohumeral  joint injection.  She is not interested in any surgical intervention at this time.  Follow-Up Instructions: No follow-ups on file.   Orders:  Orders Placed This Encounter  Procedures  . Ambulatory referral to Physical Medicine Rehab   No orders of the defined types were placed in this encounter.     Procedures: No procedures performed   Clinical Data: No additional findings.  Objective: Vital Signs: There were no vitals taken for this visit.  Physical Exam:  Constitutional: Patient appears well-developed HEENT:  Head: Normocephalic Eyes:EOM are normal Neck: Normal range of motion Cardiovascular: Normal rate Pulmonary/chest: Effort normal Neurologic: Patient is alert Skin: Skin is warm Psychiatric: Patient has normal mood and affect  Ortho Exam: Ortho exam demonstrates right shoulder with 70 degrees external rotation, 95 degrees abduction, 150 degrees forward flexion.  She has slight weakness of the infraspinatus of the right shoulder compared to the contralateral side.  No other rotator cuff weakness noted.  There is some crepitus with passive range of motion of the right shoulder.  No tenderness of the axial cervical spine, AC joint, bicipital groove.  Specialty Comments:  No specialty comments available.  Imaging: No results found.   PMFS History: Patient Active Problem List   Diagnosis Date Noted  . Vasomotor symptoms due to  menopause 08/16/2019  . Hypothyroidism 08/10/2019  . Hyperlipidemia 08/10/2019   Past Medical History:  Diagnosis Date  . Thyroid disease     Family History  Problem Relation Age of Onset  . Cancer Mother     Past Surgical History:  Procedure Laterality Date  . APPENDECTOMY     64 years old  . CERVICAL FUSION  2020   C5-6 ACDF  . JOINT REPLACEMENT Bilateral    Hips  . LUMBAR SPINE SURGERY  2020   [facility] [place]  . THYROIDECTOMY     Social History   Occupational History  . Not on file  Tobacco Use  .  Smoking status: Light Tobacco Smoker    Types: Cigarettes  . Smokeless tobacco: Never Used  Vaping Use  . Vaping Use: Never used  Substance and Sexual Activity  . Alcohol use: Not on file    Comment: socially  . Drug use: Not Currently  . Sexual activity: Not on file

## 2020-06-29 ENCOUNTER — Encounter: Payer: Self-pay | Admitting: Physical Medicine and Rehabilitation

## 2020-06-29 ENCOUNTER — Other Ambulatory Visit: Payer: Self-pay

## 2020-06-29 ENCOUNTER — Ambulatory Visit: Payer: Self-pay

## 2020-06-29 ENCOUNTER — Ambulatory Visit (INDEPENDENT_AMBULATORY_CARE_PROVIDER_SITE_OTHER): Payer: Medicare HMO | Admitting: Physical Medicine and Rehabilitation

## 2020-06-29 DIAGNOSIS — G8929 Other chronic pain: Secondary | ICD-10-CM | POA: Diagnosis not present

## 2020-06-29 DIAGNOSIS — M25511 Pain in right shoulder: Secondary | ICD-10-CM

## 2020-06-29 NOTE — Progress Notes (Signed)
   Tina Bartlett - 64 y.o. female MRN [id number]  Date of birth: 08-13-1955  Office Visit Note: Visit Date: 06/29/2020 PCP: Arvilla Market, DO Referred by: Leary Roca*  Subjective: Chief Complaint  Patient presents with  . Right Shoulder - Pain   HPI:  Tina Bartlett is a 64 y.o. female who comes in today at the request of Dr. Burnard Bunting for planned Right anesthetic glenohumeral joint arthrogram with fluoroscopic guidance.  The patient has failed conservative care including home exercise, medications, time and activity modification.  This injection will be diagnostic and hopefully therapeutic.  Please see requesting physician notes for further details and justification.   ROS Otherwise per HPI.  Assessment & Plan: Visit Diagnoses:    ICD-10-CM   1. Chronic right shoulder pain  M25.511 XR C-ARM NO REPORT   G89.29 Large Joint Inj: R glenohumeral    Plan: No additional findings.   Meds & Orders: No orders of the defined types were placed in this encounter.   Orders Placed This Encounter  Procedures  . Large Joint Inj: R glenohumeral  . XR C-ARM NO REPORT    Follow-up: Return for  Burnard Bunting, MD.   Procedures: Large Joint Inj: R glenohumeral on 06/29/2020 8:17 AM Indications: pain and diagnostic evaluation Details: 22 G 3.5 in needle, fluoroscopy-guided anteromedial approach  Arthrogram: No  Medications: 3 mL bupivacaine 0.5 %; 60 mg triamcinolone acetonide 40 MG/ML Outcome: tolerated well, no immediate complications  There was excellent flow of contrast producing a partial arthrogram of the glenohumeral joint. The patient did have relief of symptoms during the anesthetic phase of the injection. Procedure, treatment alternatives, risks and benefits explained, specific risks discussed. Consent was given by the patient. Immediately prior to procedure a time out was called to verify the correct patient, procedure, equipment, support staff and site/side  marked as required. Patient was prepped and draped in the usual sterile fashion.          Clinical History: No specialty comments available.     Objective:  VS:  HT:    WT:   BMI:     BP:   HR: bpm  TEMP: ( )  RESP:  Physical Exam   Imaging: No results found.

## 2020-06-29 NOTE — Progress Notes (Signed)
Right shoulder pain. Great relief from last injection. Numeric Pain Rating Scale and Functional Assessment Average Pain 8   In the last MONTH (on 0-10 scale) has pain interfered with the following?  1. General activity like being  able to carry out your everyday physical activities such as walking, climbing stairs, carrying groceries, or moving a chair?  Rating(9)    -Dye Allergies.

## 2020-07-07 ENCOUNTER — Other Ambulatory Visit: Payer: Self-pay | Admitting: Internal Medicine

## 2020-07-21 ENCOUNTER — Other Ambulatory Visit: Payer: Self-pay | Admitting: Internal Medicine

## 2020-07-25 ENCOUNTER — Other Ambulatory Visit: Payer: Self-pay | Admitting: Internal Medicine

## 2020-07-25 NOTE — Telephone Encounter (Signed)
Patient called in requesting refills of her stool softener. Patient states she is constipated. Please follow up.

## 2020-08-04 ENCOUNTER — Encounter: Payer: Self-pay | Admitting: Internal Medicine

## 2020-08-04 ENCOUNTER — Other Ambulatory Visit: Payer: Self-pay

## 2020-08-04 ENCOUNTER — Ambulatory Visit (INDEPENDENT_AMBULATORY_CARE_PROVIDER_SITE_OTHER): Payer: Medicare Other | Admitting: Internal Medicine

## 2020-08-04 VITALS — BP 99/67 | HR 70 | Temp 97.2°F | Resp 18 | Ht 59.0 in | Wt 139.8 lb

## 2020-08-04 DIAGNOSIS — M546 Pain in thoracic spine: Secondary | ICD-10-CM

## 2020-08-04 DIAGNOSIS — M25521 Pain in right elbow: Secondary | ICD-10-CM

## 2020-08-04 NOTE — Progress Notes (Signed)
-  Chronic R elbow pain, worsening x 4 days, denies injury, requesting imaging   -Also noted painful knot to upper back R side, 1st noted x 2 days ago

## 2020-08-04 NOTE — Progress Notes (Signed)
  Subjective:    Tina Bartlett - 65 y.o. female MRN [id number]  Date of birth: July 26, 1955  HPI  Tina Bartlett is here for right elbow pain. Reports started on Monday. Hurts over the olecranon area. Has chronic right shoulder pain from OA. This pain is different. Movement doesn't seem to make it particularly worse per patient but then she shows that lifting her arm up seems to make the pain worse. No change in strength. No loss in grip. No new numbness/tingling. No erythema, edema, rashes.   Also has concerns about "knot" on the right side of her back. Had pain in this area when she woke up Tuesday morning and had trouble getting out of bed due to back pain. Pain has been improving. She denies changes in bowel/bladder. No saddle anesthesia.   Has been using Lidocaine patches over her elbow. She does not like taking any type of medications.   While in the exam room, she remembers that she lifted a very heavy mop a couple days prior to these things developing. No other injuries or falls.      Health Maintenance:  Health Maintenance Due  Topic Date Due  . PAP SMEAR-Modifier  Never done    -  reports that she has been smoking cigarettes. She has never used smokeless tobacco. - Review of Systems: Per HPI. - Past Medical History: Patient Active Problem List   Diagnosis Date Noted  . Vasomotor symptoms due to menopause 08/16/2019  . Hypothyroidism 08/10/2019  . Hyperlipidemia 08/10/2019   - Medications: reviewed and updated   Objective:   Physical Exam BP 99/67 (BP Location: Left Arm, Patient Position: Sitting, Cuff Size: Normal)   Pulse 70   Temp (!) 97.2 F (36.2 C) (Temporal)   Resp 18   Ht 4\' 11"  (1.499 m)   Wt 139 lb 12.8 oz (63.4 kg)   SpO2 96%   BMI 28.24 kg/m  Physical Exam Constitutional:      General: She is not in acute distress.    Appearance: She is not diaphoretic.  Cardiovascular:     Rate and Rhythm: Normal rate.  Pulmonary:     Effort: Pulmonary effort is  normal. No respiratory distress.  Musculoskeletal:        General: Normal range of motion.     Comments: Np erythema, edema, or joint abnormalities at the elbow. TTP over the olecranon. No TTP over the lateral or medial epicondyles. Supination and pronation ROM preserved. Strength with resisted supination and pronation normal. Right latissimus dorsi with tension below the right ribs.   Skin:    General: Skin is warm and dry.  Neurological:     Mental Status: She is alert and oriented to person, place, and time.  Psychiatric:        Mood and Affect: Affect normal.        Judgment: Judgment normal.            Assessment & Plan:   1. Right elbow pain Suspect related to acute tendon strain from heavy lifting. Given TTP over the bone, offered x-ray to examine for bony abnormalities. Patient declined. Possible that patient has underlying OA in this area as well. She denied NSAIDs. Continue topical pain relief. Encouraged rest and ice as well.   2. Acute right-sided thoracic back pain Suspect related to muscle strain. Has not red flag symptoms. Improving. Continue supportive care and to monitor.   , D.O. 08/04/2020, 11:10 AM Primary Care at [facility]

## 2020-08-26 ENCOUNTER — Other Ambulatory Visit: Payer: Self-pay | Admitting: Internal Medicine

## 2020-08-30 MED ORDER — TRIAMCINOLONE ACETONIDE 40 MG/ML IJ SUSP
60.0000 mg | INTRAMUSCULAR | Status: AC | PRN
  Administered 2020-06-29: 60 mg via INTRA_ARTICULAR

## 2020-08-30 MED ORDER — BUPIVACAINE HCL 0.5 % IJ SOLN
3.0000 mL | INTRAMUSCULAR | Status: AC | PRN
  Administered 2020-06-29: 3 mL via INTRA_ARTICULAR

## 2020-10-12 ENCOUNTER — Other Ambulatory Visit: Payer: Self-pay | Admitting: Internal Medicine

## 2020-10-16 ENCOUNTER — Telehealth: Payer: Self-pay | Admitting: Physical Medicine and Rehabilitation

## 2020-10-16 NOTE — Telephone Encounter (Signed)
Right shoulder injection 06/29/20. Ok to repeat if helped, same problem/side, and no new injury?

## 2020-10-16 NOTE — Telephone Encounter (Signed)
Ok, may  need August Saucer F/up at some point

## 2020-10-16 NOTE — Telephone Encounter (Signed)
Pt called stating she would like to see Dr. Alvester Morin for a shoulder injection, pt would like a CB.   [phone]

## 2020-10-16 NOTE — Telephone Encounter (Signed)
Scheduled for 4/4 at 0845.

## 2020-10-19 ENCOUNTER — Telehealth: Payer: Self-pay | Admitting: Internal Medicine

## 2020-10-19 ENCOUNTER — Other Ambulatory Visit: Payer: Self-pay | Admitting: Internal Medicine

## 2020-10-19 NOTE — Telephone Encounter (Signed)
Pt is requesting a refill for lubiprostone lubiprostone (AMITIZA) 24 MCG capsule   baclofen baclofen (LIORESAL) 20 MG tablet and   gabapentin gabapentin (NEURONTIN) 600 MG tablet  [id number] - [place], [place] - [address] (Ph: [phone])   Please advise and thank you.

## 2020-10-23 ENCOUNTER — Ambulatory Visit: Payer: Self-pay

## 2020-10-23 ENCOUNTER — Other Ambulatory Visit: Payer: Self-pay

## 2020-10-23 ENCOUNTER — Telehealth: Payer: Self-pay

## 2020-10-23 ENCOUNTER — Other Ambulatory Visit: Payer: Self-pay | Admitting: Internal Medicine

## 2020-10-23 ENCOUNTER — Encounter: Payer: Self-pay | Admitting: Physical Medicine and Rehabilitation

## 2020-10-23 ENCOUNTER — Ambulatory Visit (INDEPENDENT_AMBULATORY_CARE_PROVIDER_SITE_OTHER): Payer: Medicare Other | Admitting: Physical Medicine and Rehabilitation

## 2020-10-23 DIAGNOSIS — M25511 Pain in right shoulder: Secondary | ICD-10-CM | POA: Diagnosis not present

## 2020-10-23 DIAGNOSIS — G8929 Other chronic pain: Secondary | ICD-10-CM

## 2020-10-23 MED ORDER — BACLOFEN 20 MG PO TABS: 20.0000 mg | ORAL_TABLET | Freq: Every day | ORAL | 0 refills | Status: DC

## 2020-10-23 MED ORDER — TRIAMCINOLONE ACETONIDE 40 MG/ML IJ SUSP
40.0000 mg | INTRAMUSCULAR | Status: AC | PRN
  Administered 2020-10-23: 40 mg via INTRA_ARTICULAR

## 2020-10-23 MED ORDER — BUPIVACAINE HCL 0.5 % IJ SOLN
3.0000 mL | INTRAMUSCULAR | Status: AC | PRN
Start: 2020-10-23 — End: 2020-10-23
  Administered 2020-10-23: 3 mL via INTRA_ARTICULAR

## 2020-10-23 MED ORDER — LUBIPROSTONE 24 MCG PO CAPS: 24.0000 ug | ORAL_CAPSULE | Freq: Two times a day (BID) | ORAL | 1 refills | Status: DC

## 2020-10-23 MED ORDER — GABAPENTIN 600 MG PO TABS: 600.0000 mg | ORAL_TABLET | Freq: Three times a day (TID) | ORAL | 2 refills | Status: DC

## 2020-10-23 NOTE — Progress Notes (Signed)
   Tina Bartlett - 65 y.o. female MRN [id number]  Date of birth: [birth year]  Office Visit Note: Visit Date: 10/23/2020 PCP: Arvilla Market, DO Referred by: Leary Roca*  Subjective: Chief Complaint  Patient presents with  . Right Shoulder - Pain   HPI:  Tina Bartlett is a 65 y.o. female who comes in today for planned repeat Right anesthetic glenohumeral arthrogram with fluoroscopic guidance.  The patient has failed conservative care including home exercise, medications, time and activity modification. Prior injection in December 2021 gave more than 50% relief for several months. This injection will be diagnostic and hopefully therapeutic.  Please see requesting physician notes for further details and justification.  Referring: Dr. Marrianne Mood Dean   ROS Otherwise per HPI.  Assessment & Plan: Visit Diagnoses:    ICD-10-CM   1. Chronic right shoulder pain  M25.511 XR C-ARM NO REPORT   G89.29     Plan: No additional findings.   Meds & Orders: No orders of the defined types were placed in this encounter.   Orders Placed This Encounter  Procedures  . Large Joint Inj  . XR C-ARM NO REPORT    Follow-up: Return if symptoms worsen or fail to improve.   Procedures: Large Joint Inj: R glenohumeral on 10/23/2020 9:12 AM Indications: pain and diagnostic evaluation Details: 22 G 3.5 in needle, fluoroscopy-guided anteromedial approach  Arthrogram: No  Medications: 3 mL bupivacaine 0.5 %; 40 mg triamcinolone acetonide 40 MG/ML Outcome: tolerated well, no immediate complications  There was excellent flow of contrast producing a partial arthrogram of the glenohumeral joint. The patient did have relief of symptoms during the anesthetic phase of the injection. Procedure, treatment alternatives, risks and benefits explained, specific risks discussed. Consent was given by the patient. Immediately prior to procedure a time out was called to verify the correct patient,  procedure, equipment, support staff and site/side marked as required. Patient was prepped and draped in the usual sterile fashion.          Clinical History: No specialty comments available.     Objective:  VS:  HT:    WT:   BMI:     BP:   HR: bpm  TEMP: ( )  RESP:  Physical Exam Musculoskeletal:     Comments: Right shoulder painful ROM      Imaging: No results found.

## 2020-10-23 NOTE — Telephone Encounter (Signed)
Refills sent.   Marcy Siren, D.O. Primary Care at [facility]  10/23/2020, 9:03 AM

## 2020-10-23 NOTE — Telephone Encounter (Signed)
Find out what the sxs are, the injection looked great, the fluoroscopic images are fine. If increased pain today then ICE and whatever she takes for pain. It may be the volume of medication for small space left in the joint was too much but that should calm down.

## 2020-10-23 NOTE — Telephone Encounter (Signed)
Pt called stating that she is in extreme pain since her injection this am.  She would like to have a call back to discuss her symptoms.  Pt stated that she hasn't received any relief she is using a heating pad

## 2020-10-23 NOTE — Telephone Encounter (Signed)
Called patient to advise. She states that she was having pain from behind her ear down to her wrist when she tried to rotate her shoulder. She states that she took a warm shower, applied heat, and took 2 gabapentin. She states that the pain is getting better now. She will call us back if it does not continue to improve.

## 2020-10-23 NOTE — Progress Notes (Signed)
Pt state right shoulder pain. Pt state pushing, and pulling and folding cloths makes the pain worse. Pt state she take pain meds to helps ease the pain.   Numeric Pain Rating Scale and Functional Assessment Average Pain 10   In the last MONTH (on 0-10 scale) has pain interfered with the following?  1. General activity like being  able to carry out your everyday physical activities such as walking, climbing stairs, carrying groceries, or moving a chair?  Rating(10)

## 2020-10-23 NOTE — Telephone Encounter (Signed)
If referral to the "ear" then likely myofascial or muscle in nature, ie trigger point. Might be from lying on table supine and tensing up?

## 2020-10-23 NOTE — Telephone Encounter (Signed)
Please advise 

## 2020-10-23 NOTE — Telephone Encounter (Signed)
Please Advise

## 2020-11-15 ENCOUNTER — Ambulatory Visit: Payer: Medicare Other | Admitting: Internal Medicine

## 2020-11-22 ENCOUNTER — Ambulatory Visit (INDEPENDENT_AMBULATORY_CARE_PROVIDER_SITE_OTHER): Payer: Medicare Other | Admitting: Internal Medicine

## 2020-11-22 ENCOUNTER — Other Ambulatory Visit: Payer: Self-pay

## 2020-11-22 ENCOUNTER — Encounter: Payer: Self-pay | Admitting: Internal Medicine

## 2020-11-22 VITALS — BP 120/82 | HR 72 | Resp 16 | Wt 134.8 lb

## 2020-11-22 DIAGNOSIS — R7989 Other specified abnormal findings of blood chemistry: Secondary | ICD-10-CM | POA: Diagnosis not present

## 2020-11-22 DIAGNOSIS — Z1231 Encounter for screening mammogram for malignant neoplasm of breast: Secondary | ICD-10-CM | POA: Diagnosis not present

## 2020-11-22 DIAGNOSIS — E039 Hypothyroidism, unspecified: Secondary | ICD-10-CM | POA: Diagnosis not present

## 2020-11-22 DIAGNOSIS — E785 Hyperlipidemia, unspecified: Secondary | ICD-10-CM

## 2020-11-22 MED ORDER — BACLOFEN 20 MG PO TABS: 20.0000 mg | ORAL_TABLET | Freq: Every day | ORAL | 0 refills | Status: DC

## 2020-11-22 MED ORDER — LEVOTHYROXINE SODIUM 100 MCG PO TABS: 100.0000 ug | ORAL_TABLET | Freq: Every day | ORAL | 1 refills | Status: DC

## 2020-11-22 MED ORDER — SIMVASTATIN 40 MG PO TABS
40.0000 mg | ORAL_TABLET | Freq: Every day | ORAL | 1 refills | Status: DC
Start: 2020-11-22 — End: 2021-06-13

## 2020-11-22 NOTE — Progress Notes (Signed)
  Subjective:    Tina Bartlett - 65 y.o. female MRN [id number]  Date of birth: 1956/04/10  HPI  Tina Bartlett is here for follow up. She reports she is doing well today. She has no concerns. She declines any lab monitoring.     Health Maintenance:  Health Maintenance Due  Topic Date Due  . PAP SMEAR-Modifier  Never done  . MAMMOGRAM  08/20/2020    -  reports that she has been smoking cigarettes. She has never used smokeless tobacco. - Review of Systems: Per HPI. - Past Medical History: Patient Active Problem List   Diagnosis Date Noted  . Vasomotor symptoms due to menopause 08/16/2019  . Hypothyroidism 08/10/2019  . Hyperlipidemia 08/10/2019   - Medications: reviewed and updated   Objective:   Physical Exam BP 120/82   Pulse 72   Resp 16   Wt 134 lb 12.8 oz (61.1 kg)   SpO2 96%   BMI 27.23 kg/m  Physical Exam Constitutional:      General: She is not in acute distress.    Appearance: She is not diaphoretic.  HENT:     Head: Normocephalic and atraumatic.  Eyes:     Conjunctiva/sclera: Conjunctivae normal.  Cardiovascular:     Rate and Rhythm: Normal rate and regular rhythm.     Heart sounds: Normal heart sounds. No murmur heard.   Pulmonary:     Effort: Pulmonary effort is normal. No respiratory distress.     Breath sounds: Normal breath sounds.  Musculoskeletal:        General: Normal range of motion.  Skin:    General: Skin is warm and dry.  Neurological:     Mental Status: She is alert and oriented to person, place, and time.  Psychiatric:        Mood and Affect: Affect normal.        Judgment: Judgment normal.            Assessment & Plan:  1. Elevated serum creatinine Discussed with patient that kidney function was reduced on labs in Aug 2021 and I had recommended repeat monitoring. She declines labs. Encouraged hydration and avoidance of NSAIDs.   2. Encounter for screening mammogram for malignant neoplasm of breast - MM DIGITAL SCREENING  BILATERAL; Future  3. Hypothyroidism, unspecified type Declines lab monitoring. TSH was within normal limits in Aug 2021. Advised would recommend yearly screening at minimum.  - levothyroxine (SYNTHROID) 100 MCG tablet; Take 1 tablet (100 mcg total) by mouth daily.  Dispense: 90 tablet; Refill: 1  4. Hyperlipidemia, unspecified hyperlipidemia type - simvastatin (ZOCOR) 40 MG tablet; Take 1 tablet (40 mg total) by mouth daily.  Dispense: 90 tablet; Refill: 1   Marcy Siren, D.O. 11/22/2020, 3:18 PM Primary Care at [facility]

## 2021-01-17 ENCOUNTER — Telehealth: Payer: Self-pay | Admitting: Physical Medicine and Rehabilitation

## 2021-01-17 NOTE — Telephone Encounter (Signed)
Patient called. She would like an appointment with Dr. Newton.  

## 2021-01-18 NOTE — Telephone Encounter (Signed)
Right shoulder injection on 10/23/20. Ok to repeat?

## 2021-01-18 NOTE — Telephone Encounter (Signed)
Patient states that she had no relief at all following the last injection. Scheduled for OV with Franky Macho on 7/12.

## 2021-01-29 ENCOUNTER — Telehealth: Payer: Self-pay | Admitting: Internal Medicine

## 2021-01-29 ENCOUNTER — Other Ambulatory Visit: Payer: Self-pay

## 2021-01-29 DIAGNOSIS — K219 Gastro-esophageal reflux disease without esophagitis: Secondary | ICD-10-CM

## 2021-01-29 MED ORDER — PANTOPRAZOLE SODIUM 40 MG PO TBEC: 40.0000 mg | DELAYED_RELEASE_TABLET | Freq: Every day | ORAL | 0 refills | Status: DC

## 2021-01-29 NOTE — Telephone Encounter (Signed)
Patient was seen 2 months ago. She is stating she needs a refill called in for her medication pantoprazole (PROTONIX) 40 MG tablet   Pharmacy  [phone] [place], [place] - [address]  [address], [place] [address]  Phone:  [phone]  Fax:  [phone]  DEA #:  [id number]

## 2021-01-30 ENCOUNTER — Other Ambulatory Visit: Payer: Self-pay

## 2021-01-30 ENCOUNTER — Ambulatory Visit: Payer: Self-pay

## 2021-01-30 ENCOUNTER — Ambulatory Visit (INDEPENDENT_AMBULATORY_CARE_PROVIDER_SITE_OTHER): Payer: Medicare Other | Admitting: Surgical

## 2021-01-30 ENCOUNTER — Ambulatory Visit: Payer: Medicare Other

## 2021-01-30 DIAGNOSIS — M79601 Pain in right arm: Secondary | ICD-10-CM

## 2021-01-30 MED ORDER — METHYLPREDNISOLONE 4 MG PO TBPK: ORAL_TABLET | ORAL | 0 refills | Status: DC

## 2021-02-02 ENCOUNTER — Encounter: Payer: Self-pay | Admitting: Surgical

## 2021-02-02 NOTE — Progress Notes (Signed)
Office Visit Note   Patient: Elan Mcelvain           Date of Birth: 1955-11-21           MRN: [id number] Visit Date: 01/30/2021 Requested by: Arvilla Market, MD [address] [place],  [place] [address] PCP: Arvilla Market, MD  Subjective: Chief Complaint  Patient presents with   Right Shoulder - Pain   Neck - Pain    HPI: Milea Klink is a 65 y.o. female who presents to the office complaining of chronic pain of the right shoulder.  Patient has history of right shoulder glenohumeral osteoarthritis and has received multiple injections by Dr. Alvester Morin.  Most recent injection was about 2 months ago with no relief of her current symptoms.  She has radicular arm pain that travels down her arm into her right hand with radiation from the shoulder into the neck.  She states that her pain is very severe pretty much at all times and causes her to cry throughout the day.  She wakes with pain every night multiple times throughout the night.  She describes the pain as a sharp burning sensation.  She also has noticed new left arm radicular pain with numbness and tingling throughout the entire arm at times on the left side.  She has history of prior cervical spine surgery 2 years ago as well as prior lumbar spine surgery.  No history of diabetes..                ROS: All systems reviewed are negative as they relate to the chief complaint within the history of present illness.  Patient denies fevers or chills.  Assessment & Plan: Visit Diagnoses:  1. Right arm pain     Plan: Patient is a 65 year old female who presents complaining of right shoulder pain.  She has history of glenohumeral osteoarthritis and has received previous injections by Dr. Alvester Morin into the glenohumeral joint with good relief of her pain.  However, her most recent bout of pain in the right shoulder feels different than previous episodes with new pain that travels down the entirety of the right arm into her  right hand.  She has numbness and tingling throughout the whole right arm and has also began to have left arm symptoms with the entire left arm going numb and burning pain into the left hand as well.  She has associated neck pain and this pain is so severe that it causes her to cry and wake up throughout the night.  No motor weakness on exam today but she does have positive Spurling sign and significant pain with passive and active motion of the cervical spine.  Radiographs of the right shoulder taken today show no progression of her glenohumeral osteoarthritis from prior radiographs.  She does have history of prior C5-C6, C6-C7 fusion about 2 years ago outside [place].  Radiographs show hardware in good position without any compromise and good consolidation at C5-C6 with less evidence of consolidation at C6-C7.  Plan to obtain CT scan of the cervical spine for further evaluation of fusion consolidation and evaluation of adjacent segment disease that may be contributing to her bilateral whole arm radicular pain that mostly affects her shoulders.  No sign of myelopathy on exam today.  Follow-Up Instructions: No follow-ups on file.   Orders:  Orders Placed This Encounter  Procedures   XR Shoulder Right   XR Cervical Spine 2 or 3 views   CT  CERVICAL SPINE WO CONTRAST   Meds ordered this encounter  Medications   methylPREDNISolone (MEDROL DOSEPAK) 4 MG TBPK tablet    Sig: Take dosepak as directed    Dispense:  21 tablet    Refill:  0      Procedures: No procedures performed   Clinical Data: No additional findings.  Objective: Vital Signs: There were no vitals taken for this visit.  Physical Exam:  Constitutional: Patient appears well-developed HEENT:  Head: Normocephalic Eyes:EOM are normal Neck: Normal range of motion Cardiovascular: Normal rate Pulmonary/chest: Effort normal Neurologic: Patient is alert Skin: Skin is warm Psychiatric: Patient has normal mood and  affect  Ortho Exam: Ortho exam demonstrates tenderness throughout the axial cervical spine.  5/5 motor strength of bilateral grip strength, finger abduction, pronation/supination, bicep, tricep, deltoid.  2+ bicep tendon reflex bilaterally.  Positive Spurling sign with radicular pain that is elicited and travels down her entire right arm.  She has increased pain with active extension of the cervical spine as well as looking to her left and right.  No pain with looking down.  Excellent rotator cuff strength of supra, infra, subscap bilaterally.  Negative Hoffmann sign bilaterally.  Specialty Comments:  No specialty comments available.  Imaging: No results found.   PMFS History: Patient Active Problem List   Diagnosis Date Noted   Vasomotor symptoms due to menopause 08/16/2019   Hypothyroidism 08/10/2019   Hyperlipidemia 08/10/2019   Past Medical History:  Diagnosis Date   Thyroid disease     Family History  Problem Relation Age of Onset   Cancer Mother     Past Surgical History:  Procedure Laterality Date   APPENDECTOMY     65 years old   CERVICAL FUSION  2020   C5-6 ACDF   JOINT REPLACEMENT Bilateral    Hips   LUMBAR SPINE SURGERY  2020   [facility] [place]   THYROIDECTOMY     Social History   Occupational History   Not on file  Tobacco Use   Smoking status: Light Smoker    Types: Cigarettes   Smokeless tobacco: Never  Vaping Use   Vaping Use: Never used  Substance and Sexual Activity   Alcohol use: Not on file    Comment: socially   Drug use: Not Currently   Sexual activity: Not on file

## 2021-02-15 ENCOUNTER — Ambulatory Visit
Admission: RE | Admit: 2021-02-15 | Discharge: 2021-02-15 | Disposition: A | Discharge status: Home / Self Care | Payer: Medicare Other | Source: Ambulatory Visit | Attending: Surgical | Admitting: Surgical

## 2021-02-15 ENCOUNTER — Other Ambulatory Visit: Payer: Self-pay

## 2021-02-15 DIAGNOSIS — M4322 Fusion of spine, cervical region: Secondary | ICD-10-CM | POA: Diagnosis not present

## 2021-02-15 DIAGNOSIS — M79601 Pain in right arm: Secondary | ICD-10-CM

## 2021-02-15 DIAGNOSIS — M47812 Spondylosis without myelopathy or radiculopathy, cervical region: Secondary | ICD-10-CM | POA: Diagnosis not present

## 2021-02-15 DIAGNOSIS — M4802 Spinal stenosis, cervical region: Secondary | ICD-10-CM | POA: Diagnosis not present

## 2021-02-15 DIAGNOSIS — R531 Weakness: Secondary | ICD-10-CM | POA: Diagnosis not present

## 2021-02-19 ENCOUNTER — Other Ambulatory Visit: Payer: Self-pay

## 2021-02-19 ENCOUNTER — Encounter: Payer: Self-pay | Admitting: Orthopedic Surgery

## 2021-02-19 ENCOUNTER — Ambulatory Visit: Payer: Medicare Other

## 2021-02-19 ENCOUNTER — Ambulatory Visit (INDEPENDENT_AMBULATORY_CARE_PROVIDER_SITE_OTHER): Payer: Medicare Other | Admitting: Orthopedic Surgery

## 2021-02-19 DIAGNOSIS — M79601 Pain in right arm: Secondary | ICD-10-CM | POA: Diagnosis not present

## 2021-02-19 MED ORDER — ALPRAZOLAM 1 MG PO TABS: 1.0000 mg | ORAL_TABLET | Freq: Every evening | ORAL | 0 refills | Status: DC | PRN

## 2021-02-19 NOTE — Progress Notes (Signed)
Office Visit Note   Patient: Tina Bartlett           Date of Birth: 1956-04-01           MRN: [id number] Visit Date: 02/19/2021 Requested by: Arvilla Market, MD [address] [place],  [place] [address] PCP: Arvilla Market, MD  Subjective: Chief Complaint  Patient presents with   Other    Scan review    HPI: Tina Bartlett is a 65 y.o. female who presents to the office complaining of neck and shoulder pain.  Patient complains of continued sharp burning pain through her neck and lateral shoulder with radicular pain down the arm into the forearm.  She has associated pain in her shoulder blades along the medial border the scapula.  Pain keeps her from sleeping.  She has a history of glenohumeral osteoarthritis with good relief from injections by Dr. Alvester Morin.  However, most recent injection provided no relief to her current pain.  She returns to discuss CT scan results of the cervical spine.  She also reports difficulty lifting her arm at times that seems to be worsening..                ROS: All systems reviewed are negative as they relate to the chief complaint within the history of present illness.  Patient denies fevers or chills.  Assessment & Plan: Visit Diagnoses:  1. Right arm pain     Plan: Patient is a 65 year old female who presents complaining of right shoulder and neck pain.  She returns to discuss CT scan results of cervical spine.  CT noted mild foraminal narrowing at C5-C6 and C6-C7 with pseudoarthrosis at C6-C7.  She has history of prior ACDF from C5-C7 at an outside facility in [place].  She wants to avoid surgery at all costs.  Plan to arrange for her to have cervical spine ESI with Dr. Alvester Morin and also order MRI arthrogram of the right shoulder to evaluate for rotator cuff tear given her new asymmetric weakness of external rotation and supraspinatus as well as difficulty lifting her arm without the assistance of the other.  With no biceps or  triceps weakness its possible this could be a problem intrinsic to the shoulder but its more likely representative of adjacent segment disease in the C4-5 level.  Follow-Up Instructions: No follow-ups on file.   Orders:  Orders Placed This Encounter  Procedures   MR SHOULDER RIGHT W CONTRAST   Arthrogram   Ambulatory referral to Physical Medicine Rehab   Meds ordered this encounter  Medications   ALPRAZolam (XANAX) 1 MG tablet    Sig: Take 1 tablet (1 mg total) by mouth at bedtime as needed for anxiety.    Dispense:  10 tablet    Refill:  0      Procedures: No procedures performed   Clinical Data: No additional findings.  Objective: Vital Signs: There were no vitals taken for this visit.  Physical Exam:  Constitutional: Patient appears well-developed HEENT:  Head: Normocephalic Eyes:EOM are normal Neck: Normal range of motion Cardiovascular: Normal rate Pulmonary/chest: Effort normal Neurologic: Patient is alert Skin: Skin is warm Psychiatric: Patient has normal mood and affect  Ortho Exam: Ortho exam demonstrates tenderness throughout the axial cervical spine, more so around C6-C7 with pain with active range of motion of the cervical spine.  Negative Hoffmann sign bilaterally.  5/5 motor strength of bilateral grip strength, finger abduction, pronation/supination, bicep, tricep, deltoid.  Equivalent bicep tendon  reflexes rated 2+.  Right shoulder with tenderness over the bicipital groove mildly.  4/5 external rotation strength of the right shoulder compared with 5/5 external rotation strength of the left shoulder.  5/5 subscapularis strength bilaterally.  5 -/5 supraspinatus strength of the right shoulder compared with 5/5 of the left.  Difficulty lifting the arm actively without the assistance of the contralateral arm.  Specialty Comments:  No specialty comments available.  Imaging: No results found.   PMFS History: Patient Active Problem List   Diagnosis Date  Noted   Vasomotor symptoms due to menopause 08/16/2019   Hypothyroidism 08/10/2019   Hyperlipidemia 08/10/2019   Past Medical History:  Diagnosis Date   Thyroid disease     Family History  Problem Relation Age of Onset   Cancer Mother     Past Surgical History:  Procedure Laterality Date   APPENDECTOMY     65 years old   CERVICAL FUSION  2020   C5-6 ACDF   JOINT REPLACEMENT Bilateral    Hips   LUMBAR SPINE SURGERY  2020   [facility] [place]   THYROIDECTOMY     Social History   Occupational History   Not on file  Tobacco Use   Smoking status: Light Smoker    Types: Cigarettes   Smokeless tobacco: Never  Vaping Use   Vaping Use: Never used  Substance and Sexual Activity   Alcohol use: Not on file    Comment: socially   Drug use: Not Currently   Sexual activity: Not on file

## 2021-02-25 ENCOUNTER — Encounter: Payer: Self-pay | Admitting: Orthopedic Surgery

## 2021-03-05 ENCOUNTER — Ambulatory Visit: Payer: Self-pay

## 2021-03-05 ENCOUNTER — Other Ambulatory Visit: Payer: Self-pay

## 2021-03-05 ENCOUNTER — Ambulatory Visit (INDEPENDENT_AMBULATORY_CARE_PROVIDER_SITE_OTHER): Payer: Medicare Other | Admitting: Physical Medicine and Rehabilitation

## 2021-03-05 ENCOUNTER — Encounter: Payer: Self-pay | Admitting: Physical Medicine and Rehabilitation

## 2021-03-05 VITALS — BP 107/78 | HR 52

## 2021-03-05 DIAGNOSIS — M5412 Radiculopathy, cervical region: Secondary | ICD-10-CM

## 2021-03-05 MED ORDER — BETAMETHASONE SOD PHOS & ACET 6 (3-3) MG/ML IJ SUSP
12.0000 mg | Freq: Once | INTRAMUSCULAR | Status: AC
  Administered 2021-03-05: 12 mg

## 2021-03-05 NOTE — Progress Notes (Signed)
Pt state neck pain that travels to both shoulder and down to her right elbow. Pt state when she lifting her arm or trying to put on cloths makes the pain worse. Pt state she uses pain meds and pain cream to help ease her pain  Numeric Pain Rating Scale and Functional Assessment Average Pain 9   In the last MONTH (on 0-10 scale) has pain interfered with the following?  1. General activity like being  able to carry out your everyday physical activities such as walking, climbing stairs, carrying groceries, or moving a chair?  Rating(10)   +Driver, -BT, -Dye Allergies.

## 2021-03-05 NOTE — Patient Instructions (Signed)

## 2021-03-05 NOTE — Progress Notes (Signed)
Tina Bartlett - 65 y.o. female MRN [id number]  Date of birth: May 09, 1956  Office Visit Note: Visit Date: 03/05/2021 PCP: Arvilla Market, MD Referred by: Leary Roca*  Subjective: Chief Complaint  Patient presents with   Neck - Pain   Right Shoulder - Pain   Left Shoulder - Pain   Right Elbow - Pain   HPI:  Tina Bartlett is a 65 y.o. female who comes in today at the request of Dr. Burnard Bunting for planned Right C7-T1 Cervical Interlaminar epidural steroid injection with fluoroscopic guidance.  The patient has failed conservative care including home exercise, medications, time and activity modification.  This injection will be diagnostic and hopefully therapeutic.  Please see requesting physician notes for further details and justification. CT  reviewed with images and spine model.  CT reviewed in the note below.     ROS Otherwise per HPI.  Assessment & Plan: Visit Diagnoses:    ICD-10-CM   1. Cervical radiculopathy  M54.12 XR C-ARM NO REPORT    Epidural Steroid injection    betamethasone acetate-betamethasone sodium phosphate (CELESTONE) injection 12 mg      Plan: No additional findings.   Meds & Orders:  Meds ordered this encounter  Medications   betamethasone acetate-betamethasone sodium phosphate (CELESTONE) injection 12 mg    Orders Placed This Encounter  Procedures   XR C-ARM NO REPORT   Epidural Steroid injection    Follow-up: Return if symptoms worsen or fail to improve, for Burnard Bunting, MD.   Procedures: No procedures performed  Cervical Epidural Steroid Injection - Interlaminar Approach with Fluoroscopic Guidance  Patient: Tina Bartlett      Date of Birth: 06/17/1956 MRN: [id number] PCP: Arvilla Market, MD      Visit Date: 03/05/2021   Universal Protocol:    Date/Time: 08/15/228:54 AM  Consent Given By: the patient  Position: PRONE  Additional Comments: Vital signs were monitored before and after the  procedure. Patient was prepped and draped in the usual sterile fashion. The correct patient, procedure, and site was verified.   Injection Procedure Details:   Procedure diagnoses: Cervical radiculopathy [M54.12]    Meds Administered:  Meds ordered this encounter  Medications   betamethasone acetate-betamethasone sodium phosphate (CELESTONE) injection 12 mg     Laterality: Right  Location/Site: C7-T1  Needle: 3.5 in., 20 ga. Tuohy  Needle Placement: Paramedian epidural space  Findings:  -Comments: Excellent flow of contrast into the epidural space.  Procedure Details: Using a paramedian approach from the side mentioned above, the region overlying the inferior lamina was localized under fluoroscopic visualization and the soft tissues overlying this structure were infiltrated with 4 ml. of 1% Lidocaine without Epinephrine. A # 20 gauge, Tuohy needle was inserted into the epidural space using a paramedian approach.  The epidural space was localized using loss of resistance along with contralateral oblique bi-planar fluoroscopic views.  After negative aspirate for air, blood, and CSF, a 2 ml. volume of Isovue-250 was injected into the epidural space and the flow of contrast was observed. Radiographs were obtained for documentation purposes.   The injectate was administered into the level noted above.  Additional Comments:  The patient tolerated the procedure well Dressing: 2 x 2 sterile gauze and Band-Aid    Post-procedure details: Patient was observed during the procedure. Post-procedure instructions were reviewed.  Patient left the clinic in stable condition.   Clinical History: CT CERVICAL SPINE WITHOUT CONTRAST   TECHNIQUE: Multidetector CT imaging of the  cervical spine was performed without intravenous contrast. Multiplanar CT image reconstructions were also generated.   COMPARISON:  None.   FINDINGS: Alignment: Normal.   Skull base and vertebrae: No  fracture. No primary bone lesion or focal pathologic process. The patient is status post C5-7 ACDF. Lucency is seen about the screws in the C6 and C7 vertebral bodies, worse in C7. No osseous fusion across the C6-7 disc interspace is identified. There appears to be a small area of fusion across the C5-6 disc interspace.   Soft tissues and spinal canal: No prevertebral fluid or swelling. No visible canal hematoma.   Disc levels:  C2-3: Negative.   C3-4: Mild facet degenerative change.  Otherwise negative.   C4-5: Mild facet degenerative change on the right.  No stenosis.   C5-6: Status post discectomy and fusion. Uncovertebral spurring on the right causes mild foraminal narrowing. The central canal and left foramen are open.   C6-7: Status post discectomy and fusion. Uncovertebral spurring on the right causes mild foraminal narrowing. The left foramen and central canal are open.   C7-T1: Negative.   Upper chest: Clear.   Other: None   IMPRESSION: Status post C5-7 ACDF. The appearance of C6-7 is consistent with pseudoarthrosis. No bridging bone is seen across the disc interspace and there is some lucency about the screws in the vertebral bodies, worse in C7 consistent with loosening.   A small focus of fusion is seen across the C5-6 disc interspace.   Mild appearing right femoral narrowing C5-6 and C6-7 due to uncovertebral spurring.     Electronically Signed   By: Drusilla Kanner M.D.   On: 02/17/2021 09:56     Objective:  VS:  HT:    WT:   BMI:     BP:107/78  HR:(!) 52bpm  TEMP: ( )  RESP:  Physical Exam Vitals and nursing note reviewed.  Constitutional:      General: She is not in acute distress.    Appearance: Normal appearance. She is not ill-appearing.  HENT:     Head: Normocephalic and atraumatic.     Right Ear: External ear normal.     Left Ear: External ear normal.  Eyes:     Extraocular Movements: Extraocular movements intact.   Cardiovascular:     Rate and Rhythm: Normal rate.     Pulses: Normal pulses.  Musculoskeletal:     Cervical back: Tenderness present. No rigidity.     Right lower leg: No edema.     Left lower leg: No edema.     Comments: Patient has good strength in the upper extremities including 5 out of 5 strength in wrist extension long finger flexion and APB.  There is no atrophy of the hands intrinsically.  There is a negative Hoffmann's test.   Lymphadenopathy:     Cervical: No cervical adenopathy.  Skin:    Findings: No erythema, lesion or rash.  Neurological:     General: No focal deficit present.     Mental Status: She is alert and oriented to person, place, and time.     Sensory: No sensory deficit.     Motor: No weakness or abnormal muscle tone.     Coordination: Coordination normal.  Psychiatric:        Mood and Affect: Mood normal.        Behavior: Behavior normal.     Imaging: No results found.

## 2021-03-05 NOTE — Procedures (Signed)
Cervical Epidural Steroid Injection - Interlaminar Approach with Fluoroscopic Guidance  Patient: Tina Bartlett      Date of Birth: [birth year] MRN: [id number] PCP: Arvilla Market, MD      Visit Date: 03/05/2021   Universal Protocol:    Date/Time: 08/15/228:54 AM  Consent Given By: the patient  Position: PRONE  Additional Comments: Vital signs were monitored before and after the procedure. Patient was prepped and draped in the usual sterile fashion. The correct patient, procedure, and site was verified.   Injection Procedure Details:   Procedure diagnoses: Cervical radiculopathy [M54.12]    Meds Administered:  Meds ordered this encounter  Medications   betamethasone acetate-betamethasone sodium phosphate (CELESTONE) injection 12 mg     Laterality: Right  Location/Site: C7-T1  Needle: 3.5 in., 20 ga. Tuohy  Needle Placement: Paramedian epidural space  Findings:  -Comments: Excellent flow of contrast into the epidural space.  Procedure Details: Using a paramedian approach from the side mentioned above, the region overlying the inferior lamina was localized under fluoroscopic visualization and the soft tissues overlying this structure were infiltrated with 4 ml. of 1% Lidocaine without Epinephrine. A # 20 gauge, Tuohy needle was inserted into the epidural space using a paramedian approach.  The epidural space was localized using loss of resistance along with contralateral oblique bi-planar fluoroscopic views.  After negative aspirate for air, blood, and CSF, a 2 ml. volume of Isovue-250 was injected into the epidural space and the flow of contrast was observed. Radiographs were obtained for documentation purposes.   The injectate was administered into the level noted above.  Additional Comments:  The patient tolerated the procedure well Dressing: 2 x 2 sterile gauze and Band-Aid    Post-procedure details: Patient was observed during the  procedure. Post-procedure instructions were reviewed.  Patient left the clinic in stable condition.

## 2021-03-09 ENCOUNTER — Telehealth: Payer: Medicare Other | Admitting: Family Medicine

## 2021-03-09 ENCOUNTER — Other Ambulatory Visit: Payer: Self-pay

## 2021-03-12 ENCOUNTER — Encounter: Payer: Self-pay | Admitting: Family Medicine

## 2021-03-12 ENCOUNTER — Ambulatory Visit (INDEPENDENT_AMBULATORY_CARE_PROVIDER_SITE_OTHER): Payer: Medicare Other | Admitting: Family Medicine

## 2021-03-12 ENCOUNTER — Other Ambulatory Visit: Payer: Self-pay

## 2021-03-12 VITALS — BP 119/72 | HR 51 | Temp 97.7°F | Ht 59.0 in | Wt 132.0 lb

## 2021-03-12 DIAGNOSIS — G4709 Other insomnia: Secondary | ICD-10-CM

## 2021-03-12 DIAGNOSIS — F32A Depression, unspecified: Secondary | ICD-10-CM | POA: Diagnosis not present

## 2021-03-12 DIAGNOSIS — F419 Anxiety disorder, unspecified: Secondary | ICD-10-CM

## 2021-03-12 DIAGNOSIS — M25511 Pain in right shoulder: Secondary | ICD-10-CM

## 2021-03-12 DIAGNOSIS — E785 Hyperlipidemia, unspecified: Secondary | ICD-10-CM

## 2021-03-12 DIAGNOSIS — E039 Hypothyroidism, unspecified: Secondary | ICD-10-CM

## 2021-03-12 DIAGNOSIS — G8929 Other chronic pain: Secondary | ICD-10-CM | POA: Diagnosis not present

## 2021-03-12 MED ORDER — GABAPENTIN 600 MG PO TABS: 600.0000 mg | ORAL_TABLET | Freq: Three times a day (TID) | ORAL | 2 refills | Status: DC

## 2021-03-12 MED ORDER — PAROXETINE HCL 30 MG PO TABS: 30.0000 mg | ORAL_TABLET | Freq: Every day | ORAL | 0 refills | Status: DC

## 2021-03-12 NOTE — Progress Notes (Signed)
Established Patient Office Visit  Subjective:  Patient ID: Tina Bartlett, female    DOB: 06-03-1956  Age: 65 y.o. MRN: [id number]  CC:  Chief Complaint  Patient presents with   Medication Refill    HPI [name]    Past Medical History:  Diagnosis Date   Thyroid disease     Past Surgical History:  Procedure Laterality Date   APPENDECTOMY     65 years old   CERVICAL FUSION  2020   C5-6 ACDF   JOINT REPLACEMENT Bilateral    Hips   LUMBAR SPINE SURGERY  2020   [facility] [place]   THYROIDECTOMY      Family History  Problem Relation Age of Onset   Cancer Mother     Social History   Socioeconomic History   Marital status: Single    Spouse name: Not on file   Number of children: 0   Years of education: 13+   Highest education level: Not on file  Occupational History   Not on file  Tobacco Use   Smoking status: Light Smoker    Types: Cigarettes   Smokeless tobacco: Never  Vaping Use   Vaping Use: Never used  Substance and Sexual Activity   Alcohol use: Not on file    Comment: socially   Drug use: Not Currently   Sexual activity: Not on file  Other Topics Concern   Not on file  Social History Narrative   Not on file   Social Determinants of Health   Financial Resource Strain: Not on file  Food Insecurity: Not on file  Transportation Needs: Not on file  Physical Activity: Not on file  Stress: Not on file  Social Connections: Not on file  Intimate Partner Violence: Not on file    Outpatient Medications Prior to Visit  Medication Sig Dispense Refill   baclofen (LIORESAL) 20 MG tablet Take 1 tablet (20 mg total) by mouth daily. 90 tablet 0   cholecalciferol (VITAMIN D3) 25 MCG (1000 UNIT) tablet Take 1,000 Units by mouth daily.     levothyroxine (SYNTHROID) 100 MCG tablet Take 1 tablet (100 mcg total) by mouth daily. 90 tablet 1   lidocaine (XYLOCAINE) 5 % ointment Apply 1 application topically as needed. 35.44 g 0   lubiprostone  (AMITIZA) 24 MCG capsule Take 1 capsule (24 mcg total) by mouth 2 (two) times daily with a meal. 180 capsule 1   methylPREDNISolone (MEDROL DOSEPAK) 4 MG TBPK tablet Take dosepak as directed 21 tablet 0   pantoprazole (PROTONIX) 40 MG tablet Take 1 tablet (40 mg total) by mouth daily. 90 tablet 0   PREMARIN 0.3 MG tablet Take 0.3 mg by mouth daily.     simvastatin (ZOCOR) 40 MG tablet Take 1 tablet (40 mg total) by mouth daily. 90 tablet 1   ALPRAZolam (XANAX) 1 MG tablet Take 1 tablet (1 mg total) by mouth at bedtime as needed for anxiety. 10 tablet 0   gabapentin (NEURONTIN) 600 MG tablet Take 1 tablet (600 mg total) by mouth 3 (three) times daily. 90 tablet 2   PARoxetine (PAXIL) 20 MG tablet Take 1 tablet (20 mg total) by mouth daily. 90 tablet 0   No facility-administered medications prior to visit.    Allergies  Allergen Reactions   Hydroxyzine Other (See Comments)    Hallucinations    Nsaids Other (See Comments)    Pt reports causes "tremors and jitters"   Tramadol Nausea Only and Other (See Comments)  Causes drowsiness    Tylenol [Acetaminophen] Hives and Rash    ROS Review of Systems  All other systems reviewed and are negative.    Objective:    Physical Exam Vitals and nursing note reviewed.  Constitutional:      General: She is not in acute distress. Cardiovascular:     Rate and Rhythm: Normal rate and regular rhythm.  Pulmonary:     Effort: Pulmonary effort is normal.     Breath sounds: Normal breath sounds.  Abdominal:     Palpations: Abdomen is soft.     Tenderness: There is no abdominal tenderness.  Musculoskeletal:     Right lower leg: No edema.     Left lower leg: No edema.  Neurological:     General: No focal deficit present.     Mental Status: She is alert and oriented to person, place, and time.  Psychiatric:        Mood and Affect: Mood is anxious. Affect is labile.        Speech: Speech normal.        Behavior: Behavior normal.         Thought Content: Thought content normal.    BP 119/72   Pulse (!) 51   Temp 97.7 F (36.5 C)   Ht 4\' 11"  (1.499 m)   Wt 132 lb (59.9 kg)   SpO2 97%   BMI 26.66 kg/m  Wt Readings from Last 3 Encounters:  03/12/21 132 lb (59.9 kg)  11/22/20 134 lb 12.8 oz (61.1 kg)  08/04/20 139 lb 12.8 oz (63.4 kg)     Health Maintenance Due  Topic Date Due   PAP SMEAR-Modifier  Never done   Zoster Vaccines- Shingrix (1 of 2) Never done   COVID-19 Vaccine (4 - Booster for Pfizer series) 07/20/2020   Pneumococcal Vaccine 73-43 Years old (2 - PCV) 08/09/2020   MAMMOGRAM  08/20/2020   INFLUENZA VACCINE  02/19/2021       Assessment & Plan:   1. Anxiety and depression Patient prescribed paxil 30 mg. Referral for counseling - will monitor  - PARoxetine (PAXIL) 30 MG tablet; Take 1 tablet (30 mg total) by mouth daily.  Dispense: 90 tablet; Refill: 0  2. Hypothyroidism, unspecified type Appears stable - monitoring labs ordered  - Basic Metabolic Panel - T4, Free - TSH  3. Hyperlipidemia, unspecified hyperlipidemia type Monitoring labs ordered  - AST - ALT - Lipid Panel - Basic Metabolic Panel  4. Chronic right shoulder pain Meds refilled  - gabapentin (NEURONTIN) 600 MG tablet; Take 1 tablet (600 mg total) by mouth 3 (three) times daily.  Dispense: 90 tablet; Refill: 2  5. Other insomnia Good sleep hygiene discussed in detail    Meds ordered this encounter  Medications   PARoxetine (PAXIL) 30 MG tablet    Sig: Take 1 tablet (30 mg total) by mouth daily.    Dispense:  90 tablet    Refill:  0   gabapentin (NEURONTIN) 600 MG tablet    Sig: Take 1 tablet (600 mg total) by mouth 3 (three) times daily.    Dispense:  90 tablet    Refill:  2    Follow-up: Return in about 3 months (around 06/12/2021) for follow up, chronic med issues.    06/14/2021, MD

## 2021-03-12 NOTE — Progress Notes (Signed)
Request pain medication shoulder and neck pain  Medication refill

## 2021-03-13 LAB — BASIC METABOLIC PANEL
BUN/Creatinine Ratio: 17 (ref 12–28)
BUN: 19 mg/dL (ref 8–27)
CO2: 25 mmol/L (ref 20–29)
Calcium: 8.9 mg/dL (ref 8.7–10.3)
Chloride: 102 mmol/L (ref 96–106)
Creatinine, Ser: 1.09 mg/dL — ABNORMAL HIGH (ref 0.57–1.00)
Glucose: 78 mg/dL (ref 65–99)
Potassium: 5 mmol/L (ref 3.5–5.2)
Sodium: 141 mmol/L (ref 134–144)
eGFR: 57 mL/min/{1.73_m2} — ABNORMAL LOW (ref >59)

## 2021-03-13 LAB — LIPID PANEL
Chol/HDL Ratio: 2.7 ratio (ref 0.0–4.4)
Cholesterol, Total: 147 mg/dL (ref 100–199)
HDL: 55 mg/dL (ref >39)
LDL Chol Calc (NIH): 64 mg/dL (ref 0–99)
Triglycerides: 168 mg/dL — ABNORMAL HIGH (ref 0–149)
VLDL Cholesterol Cal: 28 mg/dL (ref 5–40)

## 2021-03-13 LAB — T4, FREE: Free T4: 1.24 ng/dL (ref 0.82–1.77)

## 2021-03-13 LAB — ALT: ALT: 22 IU/L (ref 0–32)

## 2021-03-13 LAB — TSH: TSH: 7.6 u[IU]/mL — ABNORMAL HIGH (ref 0.450–4.500)

## 2021-03-13 LAB — AST: AST: 25 IU/L (ref 0–40)

## 2021-03-19 ENCOUNTER — Telehealth: Payer: Self-pay | Admitting: Orthopedic Surgery

## 2021-03-19 NOTE — Telephone Encounter (Signed)
Rf to [facility] for eval   shot 8/15 not helpful thx

## 2021-03-19 NOTE — Telephone Encounter (Signed)
Patient has been contacted and made an appointment to see Dr. Ophelia Charter 03/20/2021 at 10:45.

## 2021-03-19 NOTE — Telephone Encounter (Signed)
Pt states the pain has gotten much worse from her shoulder blade. She states its starting to create pain in her neck! She would like to know what to do in the meantime?  CB [phone]

## 2021-03-20 ENCOUNTER — Encounter: Payer: Self-pay | Admitting: Orthopaedic Surgery

## 2021-03-20 ENCOUNTER — Ambulatory Visit
Admission: RE | Admit: 2021-03-20 | Discharge: 2021-03-20 | Disposition: A | Discharge status: Home / Self Care | Payer: Medicare Other | Source: Ambulatory Visit | Attending: Orthopedic Surgery | Admitting: Orthopedic Surgery

## 2021-03-20 ENCOUNTER — Other Ambulatory Visit: Payer: Self-pay

## 2021-03-20 ENCOUNTER — Ambulatory Visit (INDEPENDENT_AMBULATORY_CARE_PROVIDER_SITE_OTHER): Payer: Medicare Other | Admitting: Orthopaedic Surgery

## 2021-03-20 DIAGNOSIS — S129XXA Fracture of neck, unspecified, initial encounter: Secondary | ICD-10-CM | POA: Insufficient documentation

## 2021-03-20 DIAGNOSIS — M79601 Pain in right arm: Secondary | ICD-10-CM

## 2021-03-20 NOTE — Progress Notes (Addendum)
  Tina Bartlett was seen at [facility] today for MR Arthrogram of the right shoulder.  She was unable to tolerate injection of the right shoulder joint and refused to proceed.  Recommend reschedule and have the patient take Ativan 2 mg PO one hour prior to procedure.   Carlos Heber S Naysha Sholl PA-C 03/20/2021 3:03 PM

## 2021-03-20 NOTE — Progress Notes (Signed)
Office Visit Note   Patient: Tina Bartlett           Date of Birth: [birth year]           MRN: [id number] Visit Date: 03/20/2021              Requested by: Georganna Skeans, MD [address] [place],  [place] [address] PCP: Georganna Skeans, MD   Assessment & Plan: Visit Diagnoses:  1. Pseudoarthrosis of cervical spine, initial encounter [facility])     Plan: Patient has MRI later today of both her neck and her shoulder.  She can follow-up with me in 2 weeks to review the MRI of her neck.  Follow-Up Instructions: Return in about 2 weeks (around 04/03/2021).   Orders:  No orders of the defined types were placed in this encounter.  No orders of the defined types were placed in this encounter.     Procedures: No procedures performed   Clinical Data: No additional findings.   Subjective: Chief Complaint  Patient presents with   Neck - Follow-up    HPI 65 year old female seen with neck pain previous cervical fusion done 3 years ago With CT Scan Showing Pseudoarthrosis at C6-7 and likely at C5-7 as well.  Patient had surgery done in [place].  She states she has aching pain throbbing pain radiates into her shoulder and she has great difficulty actively abducting and flexing her shoulder.  She has an MRI pending of her neck and also her shoulder.  Pain radiates from her neck and her shoulder and down into her arm on the right side none on the left.  No fever chills no gait disturbance.  Previous relief with the right glenohumeral injection.  Patient states she wants to avoid surgery if possible.  She is already had a thyroid surgery in the past in addition to the anterior cervical.  Review of Systems no myelopathic symptoms no chills or fever.  All other systems noncontributory to HPI.  Previous thyroidectomy as noted above.   Objective: Vital Signs: BP 138/81 (BP Location: Left Arm, Patient Position: Sitting, Cuff Size: Normal)   Pulse 68   SpO2 96%   Physical  Exam Constitutional:      Appearance: She is well-developed.  HENT:     Head: Normocephalic.     Right Ear: External ear normal.     Left Ear: External ear normal. There is no impacted cerumen.  Eyes:     Pupils: Pupils are equal, round, and reactive to light.  Neck:     Thyroid: No thyromegaly.     Trachea: No tracheal deviation.  Cardiovascular:     Rate and Rhythm: Normal rate.  Pulmonary:     Effort: Pulmonary effort is normal.  Abdominal:     Palpations: Abdomen is soft.  Musculoskeletal:     Cervical back: No rigidity.  Skin:    General: Skin is warm and dry.  Neurological:     Mental Status: She is alert and oriented to person, place, and time.  Psychiatric:        Behavior: Behavior normal.    Ortho Exam patient has pain with hip of 10% shoulder abduction on the right arm only.  Significant brachial plexus tenderness.  Right upper extremity reflexes are 2+ and symmetrical.  Normal heel toe gait no lower extremity clonus.  Specialty Comments:  No specialty comments available.  Imaging: CLINICAL DATA:  Neck pain radiating into the right arm. Right arm weakness. History of  prior cervical fusion.   EXAM: CT CERVICAL SPINE WITHOUT CONTRAST   TECHNIQUE: Multidetector CT imaging of the cervical spine was performed without intravenous contrast. Multiplanar CT image reconstructions were also generated.   COMPARISON:  None.   FINDINGS: Alignment: Normal.   Skull base and vertebrae: No fracture. No primary bone lesion or focal pathologic process. The patient is status post C5-7 ACDF. Lucency is seen about the screws in the C6 and C7 vertebral bodies, worse in C7. No osseous fusion across the C6-7 disc interspace is identified. There appears to be a small area of fusion across the C5-6 disc interspace.   Soft tissues and spinal canal: No prevertebral fluid or swelling. No visible canal hematoma.   Disc levels:  C2-3: Negative.   C3-4: Mild facet degenerative  change.  Otherwise negative.   C4-5: Mild facet degenerative change on the right.  No stenosis.   C5-6: Status post discectomy and fusion. Uncovertebral spurring on the right causes mild foraminal narrowing. The central canal and left foramen are open.   C6-7: Status post discectomy and fusion. Uncovertebral spurring on the right causes mild foraminal narrowing. The left foramen and central canal are open.   C7-T1: Negative.   Upper chest: Clear.   Other: None   IMPRESSION: Status post C5-7 ACDF. The appearance of C6-7 is consistent with pseudoarthrosis. No bridging bone is seen across the disc interspace and there is some lucency about the screws in the vertebral bodies, worse in C7 consistent with loosening.   A small focus of fusion is seen across the C5-6 disc interspace.   Mild appearing right femoral narrowing C5-6 and C6-7 due to uncovertebral spurring.     Electronically Signed   By: Drusilla Kanner M.D.   On: 02/17/2021 09:56     PMFS History: Patient Active Problem List   Diagnosis Date Noted   Cervical pseudoarthrosis (HCC) 03/20/2021   Vasomotor symptoms due to menopause 08/16/2019   Hypothyroidism 08/10/2019   Hyperlipidemia 08/10/2019   Past Medical History:  Diagnosis Date   Thyroid disease     Family History  Problem Relation Age of Onset   Cancer Mother     Past Surgical History:  Procedure Laterality Date   APPENDECTOMY     65 years old   CERVICAL FUSION  2020   C5-6 ACDF   JOINT REPLACEMENT Bilateral    Hips   LUMBAR SPINE SURGERY  2020   [facility] [place]   THYROIDECTOMY     Social History   Occupational History   Not on file  Tobacco Use   Smoking status: Light Smoker    Types: Cigarettes   Smokeless tobacco: Never  Vaping Use   Vaping Use: Never used  Substance and Sexual Activity   Alcohol use: Not on file    Comment: socially   Drug use: Not Currently   Sexual activity: Not on file

## 2021-03-21 ENCOUNTER — Other Ambulatory Visit: Payer: Self-pay | Admitting: Orthopedic Surgery

## 2021-03-21 DIAGNOSIS — M79601 Pain in right arm: Secondary | ICD-10-CM

## 2021-03-30 ENCOUNTER — Telehealth: Payer: Self-pay | Admitting: Orthopedic Surgery

## 2021-03-30 NOTE — Telephone Encounter (Signed)
Pt is schedule for MRI on 9/20 and is requesting a sedative to calm her. Please send to pharmacy on file. Pt is requesting a call when medication has been sent in. Pt phone number is [phone].

## 2021-04-01 ENCOUNTER — Other Ambulatory Visit: Payer: Self-pay | Admitting: Surgical

## 2021-04-01 MED ORDER — DIAZEPAM 2 MG PO TABS: 2.0000 mg | ORAL_TABLET | Freq: Once | ORAL | 0 refills | Status: AC

## 2021-04-01 NOTE — Telephone Encounter (Signed)
Sent in to pharmacy.  

## 2021-04-02 ENCOUNTER — Ambulatory Visit: Payer: Medicare Other | Admitting: Orthopedic Surgery

## 2021-04-02 NOTE — Telephone Encounter (Signed)
Called PT no answer LMOM

## 2021-04-04 ENCOUNTER — Ambulatory Visit: Payer: Medicare Other | Admitting: Orthopaedic Surgery

## 2021-04-10 ENCOUNTER — Other Ambulatory Visit: Payer: Self-pay

## 2021-04-10 ENCOUNTER — Ambulatory Visit
Admission: RE | Admit: 2021-04-10 | Discharge: 2021-04-10 | Disposition: A | Discharge status: Home / Self Care | Payer: Medicare Other | Source: Ambulatory Visit | Attending: Orthopedic Surgery | Admitting: Orthopedic Surgery

## 2021-04-10 ENCOUNTER — Ambulatory Visit
Admission: RE | Admit: 2021-04-10 | Discharge: 2021-04-10 | Disposition: A | Discharge status: Home / Self Care | Payer: Medicare Other | Source: Ambulatory Visit | Attending: Internal Medicine | Admitting: Internal Medicine

## 2021-04-10 DIAGNOSIS — M79601 Pain in right arm: Secondary | ICD-10-CM

## 2021-04-10 DIAGNOSIS — Z1231 Encounter for screening mammogram for malignant neoplasm of breast: Secondary | ICD-10-CM

## 2021-04-10 MED ORDER — IOPAMIDOL (ISOVUE-M 200) INJECTION 41%
15.0000 mL | Freq: Once | INTRAMUSCULAR | Status: AC
  Administered 2021-04-10: 15 mL via INTRA_ARTICULAR

## 2021-04-10 NOTE — Progress Notes (Signed)
Pls reorder with ativan 2 mg po one hour before thx

## 2021-04-14 ENCOUNTER — Ambulatory Visit: Payer: Medicare Other

## 2021-04-14 ENCOUNTER — Telehealth: Payer: Self-pay

## 2021-04-14 NOTE — Telephone Encounter (Signed)
Called patient for AWV scheduled this morning - was unable to reach patient on phone after 3 attempts left message for patient to call PCP office to reschedule.

## 2021-04-18 ENCOUNTER — Other Ambulatory Visit: Payer: Self-pay

## 2021-04-18 ENCOUNTER — Ambulatory Visit (INDEPENDENT_AMBULATORY_CARE_PROVIDER_SITE_OTHER): Payer: Medicare Other | Admitting: Orthopedic Surgery

## 2021-04-18 DIAGNOSIS — S129XXA Fracture of neck, unspecified, initial encounter: Secondary | ICD-10-CM

## 2021-04-18 DIAGNOSIS — M75101 Unspecified rotator cuff tear or rupture of right shoulder, not specified as traumatic: Secondary | ICD-10-CM

## 2021-04-19 MED ORDER — METHYLPREDNISOLONE 4 MG PO TBPK: ORAL_TABLET | ORAL | 0 refills | Status: DC

## 2021-04-20 ENCOUNTER — Telehealth: Payer: Self-pay | Admitting: Orthopedic Surgery

## 2021-04-20 ENCOUNTER — Other Ambulatory Visit: Payer: Self-pay | Admitting: Surgical

## 2021-04-20 ENCOUNTER — Other Ambulatory Visit: Payer: Self-pay | Admitting: *Deleted

## 2021-04-20 ENCOUNTER — Ambulatory Visit: Payer: Medicare Other | Admitting: Orthopaedic Surgery

## 2021-04-20 DIAGNOSIS — E039 Hypothyroidism, unspecified: Secondary | ICD-10-CM

## 2021-04-20 DIAGNOSIS — S129XXA Fracture of neck, unspecified, initial encounter: Secondary | ICD-10-CM

## 2021-04-20 MED ORDER — TRAMADOL HCL 50 MG PO TABS: 50.0000 mg | ORAL_TABLET | Freq: Two times a day (BID) | ORAL | 0 refills | Status: DC | PRN

## 2021-04-20 MED ORDER — LEVOTHYROXINE SODIUM 100 MCG PO TABS
100.0000 ug | ORAL_TABLET | Freq: Every day | ORAL | 1 refills | Status: DC
Start: 2021-04-20 — End: 2021-06-12

## 2021-04-20 NOTE — Telephone Encounter (Signed)
done

## 2021-04-20 NOTE — Telephone Encounter (Signed)
Sent in Tramadol, which her adverse reaction is drowsiness so not a true allergy.  I also saw she doesn't have MRI c-spine ordered, can we order this so that she can review it with Dr. Ophelia Charter when she sees him in mid-October?

## 2021-04-20 NOTE — Telephone Encounter (Signed)
Patient called. She would like some pain medication called in for her. Her call back number is [phone]

## 2021-04-21 ENCOUNTER — Encounter: Payer: Self-pay | Admitting: Orthopedic Surgery

## 2021-04-21 NOTE — Progress Notes (Signed)
Office Visit Note   Patient: Tina Bartlett           Date of Birth: 02/14/1956           MRN: [id number] Visit Date: 04/18/2021 Requested by: Georganna Skeans, MD [address] [place],  [place] [address] PCP: Georganna Skeans, MD  Subjective: Chief Complaint  Patient presents with   Right Shoulder - Follow-up    Scan review    HPI: Tina Bartlett is a 65 y.o. female who presents to the office for MRI review. Patient denies any changes in symptoms.  Continues to complain mainly of right-sided shoulder neck pain with radiation down the entirety of her right arm to the distal forearm with associated numbness and tingling in both hands.  She also has more prominent left shoulder pain compared with prior visits though her right arm is the main concern today.  She is taking gabapentin without relief of her symptoms.  She has difficulty sleeping due to her symptoms.  She had no lasting relief from injection with Dr. Alvester Morin.  MRI results revealed: MR SHOULDER RIGHT W CONTRAST  Result Date: 04/12/2021 CLINICAL DATA:  Right shoulder pain with limited range of motion for 3 years EXAM: MR ARTHROGRAM OF THE RIGHT SHOULDER TECHNIQUE: Multiplanar, multisequence MR imaging of the right shoulder was performed following the administration of intra-articular contrast. CONTRAST:  See Injection Documentation. COMPARISON:  X-ray 01/30/2021 FINDINGS: Technical Note: Despite efforts by the technologist and patient, motion artifact is present on today's exam and could not be eliminated. This reduces exam sensitivity and specificity. Rotator cuff: Full-thickness incomplete tear of the distal supraspinatus tendon. The majority of the distal tendon is irregular and attenuated. Infraspinatus, subscapularis, and teres minor tendons appear intact. Muscles: Preserved bulk and signal intensity of the rotator cuff musculature without edema, atrophy, or fatty infiltration. Biceps long head: Intact. Acromioclavicular  Joint: Mild arthropathy of the AC joint. Moderate volume of contrast is present within the subacromial-subdeltoid bursal space communicating with the joint space through supraspinatus tendon tear. Glenohumeral Joint: Adequately distended with injected contrast. Mild-moderate diffuse chondral thinning of the humeral head. Labrum: Superior and posterior labrum appear blunted likely reflecting degeneration and tearing. Evaluation is limited by motion degradation. Bones: Subcortical cystic changes in the greater tuberosity. Glenohumeral joint osteophytosis. No acute fracture or dislocation. Other: None. IMPRESSION: 1. Full-thickness incomplete tear of the distal supraspinatus tendon. The majority of the distal tendon is irregular and attenuated. 2. Mild-to-moderate glenohumeral and AC joint osteoarthritis. 3. Superior and posterior labral degeneration and tearing. Electronically Signed   By: Duanne Guess D.O.   On: 04/12/2021 10:30                 ROS: All systems reviewed are negative as they relate to the chief complaint within the history of present illness.  Patient denies fevers or chills.  Assessment & Plan: Visit Diagnoses:  1. Pseudoarthrosis of cervical spine, initial encounter (HCC)   2. Tear of right supraspinatus tendon     Plan: Tina Bartlett is a 65 y.o. female who presents to the office for review of MRI of the right shoulder.  MRI reveals full-thickness incomplete tear of distal supraspinatus with mild to moderate glenohumeral arthritis.  She essentially has a functional rotator cuff tear that is likely contributing some to her pain but is likely not causing the radicular unrelenting pain that she continues to complain of.  She does have pseudoarthrosis of the cervical spine.  She is planning to follow-up  with Dr. Ophelia Charter in several weeks for repeat evaluation following her MRI of the cervical spine.  It did appear that there is no MRI of the cervical spine ordered so placed this order  today.  No surgical indication for the shoulder at this time.  Prescribe Medrol Dosepak for symptomatic relief in the meantime.  Follow-Up Instructions: No follow-ups on file.   Orders:  No orders of the defined types were placed in this encounter.  Meds ordered this encounter  Medications   methylPREDNISolone (MEDROL DOSEPAK) 4 MG TBPK tablet    Sig: Take dosepak as directed    Dispense:  21 tablet    Refill:  0      Procedures: No procedures performed   Clinical Data: No additional findings.  Objective: Vital Signs: There were no vitals taken for this visit.  Physical Exam:  Constitutional: Patient appears well-developed HEENT:  Head: Normocephalic Eyes:EOM are normal Neck: Normal range of motion Cardiovascular: Normal rate Pulmonary/chest: Effort normal Neurologic: Patient is alert Skin: Skin is warm Psychiatric: Patient has normal mood and affect  Ortho Exam: Ortho exam demonstrates right shoulder with pain with passive motion of the right shoulder.  No asymmetric loss of motion compared with the contralateral side.  She has severe pain with stressing supraspinatus, infraspinatus, subscapularis as well as with testing her strength of all of the other upper extremity muscle groups.  Positive Lhermitte sign.  Positive Spurling sign.  Tenderness throughout the axial cervical spine.  2+ bicep tendon reflexes bilaterally.  She is not cooperative throughout some parts of the exam due to pain.  She does come close to tears throughout the rest of the exam.  5/5 motor strength of bilateral grip ring, finger abduction, pronation/supination, bicep, deltoid.  5 -/5 strength of tricep on the right compared to 5/5 on the left.  Negative Hoffmann sign bilaterally.  Specialty Comments:  No specialty comments available.  Imaging: No results found.   PMFS History: Patient Active Problem List   Diagnosis Date Noted   Cervical pseudoarthrosis (HCC) 03/20/2021   Vasomotor symptoms  due to menopause 08/16/2019   Hypothyroidism 08/10/2019   Hyperlipidemia 08/10/2019   Past Medical History:  Diagnosis Date   Thyroid disease     Family History  Problem Relation Age of Onset   Cancer Mother     Past Surgical History:  Procedure Laterality Date   APPENDECTOMY     65 years old   CERVICAL FUSION  2020   C5-6 ACDF   JOINT REPLACEMENT Bilateral    Hips   LUMBAR SPINE SURGERY  2020   [facility] [place]   THYROIDECTOMY     Social History   Occupational History   Not on file  Tobacco Use   Smoking status: Light Smoker    Types: Cigarettes   Smokeless tobacco: Never  Vaping Use   Vaping Use: Never used  Substance and Sexual Activity   Alcohol use: Not on file    Comment: socially   Drug use: Not Currently   Sexual activity: Not on file

## 2021-05-01 ENCOUNTER — Ambulatory Visit: Payer: Medicare Other | Admitting: Orthopaedic Surgery

## 2021-05-03 ENCOUNTER — Telehealth: Payer: Self-pay | Admitting: Family Medicine

## 2021-05-03 ENCOUNTER — Other Ambulatory Visit: Payer: Self-pay | Admitting: Internal Medicine

## 2021-05-03 NOTE — Telephone Encounter (Signed)
States Pharmacy has sent requests and needs urgent refills for   pantoprazole (PROTONIX) 40 MG tablet [[id number]]   baclofen (LIORESAL) 20 MG tablet [[id number]]   lubiprostone (AMITIZA) 24 MCG capsule [[id number]]    Pharmacy  [facility] [phone] [place], [place] - [address]  [address], [place] [address]  Phone:  [phone]  Fax:  [phone]     Please advise and thank you

## 2021-05-04 ENCOUNTER — Other Ambulatory Visit: Payer: Self-pay | Admitting: *Deleted

## 2021-05-04 DIAGNOSIS — K219 Gastro-esophageal reflux disease without esophagitis: Secondary | ICD-10-CM

## 2021-05-04 MED ORDER — PANTOPRAZOLE SODIUM 40 MG PO TBEC: 40.0000 mg | DELAYED_RELEASE_TABLET | Freq: Every day | ORAL | 0 refills | Status: DC

## 2021-05-09 ENCOUNTER — Other Ambulatory Visit: Payer: Self-pay | Admitting: *Deleted

## 2021-05-10 ENCOUNTER — Other Ambulatory Visit: Payer: Self-pay

## 2021-05-10 ENCOUNTER — Ambulatory Visit
Admission: RE | Admit: 2021-05-10 | Discharge: 2021-05-10 | Disposition: A | Discharge status: Home / Self Care | Payer: Medicare Other | Source: Ambulatory Visit | Attending: Surgical | Admitting: Surgical

## 2021-05-10 DIAGNOSIS — S129XXA Fracture of neck, unspecified, initial encounter: Secondary | ICD-10-CM

## 2021-05-15 ENCOUNTER — Ambulatory Visit: Payer: Medicare Other | Admitting: Orthopaedic Surgery

## 2021-05-16 MED ORDER — BACLOFEN 20 MG PO TABS: 20.0000 mg | ORAL_TABLET | Freq: Every day | ORAL | 0 refills | Status: DC

## 2021-05-29 ENCOUNTER — Other Ambulatory Visit: Payer: Self-pay

## 2021-05-29 ENCOUNTER — Ambulatory Visit (INDEPENDENT_AMBULATORY_CARE_PROVIDER_SITE_OTHER): Payer: Medicare Other | Admitting: Orthopaedic Surgery

## 2021-05-29 ENCOUNTER — Ambulatory Visit: Payer: Self-pay

## 2021-05-29 ENCOUNTER — Encounter: Payer: Self-pay | Admitting: Orthopaedic Surgery

## 2021-05-29 VITALS — BP 115/78 | HR 85 | Ht 59.0 in | Wt 132.0 lb

## 2021-05-29 DIAGNOSIS — G8929 Other chronic pain: Secondary | ICD-10-CM | POA: Diagnosis not present

## 2021-05-29 DIAGNOSIS — Z9889 Other specified postprocedural states: Secondary | ICD-10-CM

## 2021-05-29 DIAGNOSIS — S129XXA Fracture of neck, unspecified, initial encounter: Secondary | ICD-10-CM

## 2021-05-29 DIAGNOSIS — M545 Low back pain, unspecified: Secondary | ICD-10-CM | POA: Diagnosis not present

## 2021-05-29 NOTE — Progress Notes (Addendum)
Office Visit Note   Patient: Tina Bartlett           Date of Birth: 09-09-1955           MRN: [id number] Visit Date: 05/29/2021              Requested by: Georganna Skeans, MD [address] [place],  [place] [address] PCP: Georganna Skeans, MD   Assessment & Plan: Visit Diagnoses:  1. Chronic bilateral low back pain, unspecified whether sciatica present   2. Pseudoarthrosis of cervical spine, initial encounter [facility])     Plan: X-rays lumbar demonstrate previous decompression surgery.  Patient has cervical pseudoarthrosis not significantly symptomatic at this point.  Previous cervical fusion done in [place].  Patient is having significant pain radiates into her shoulders consistent with right paracentral disc protrusion deforming the cord with moderate canal stenosis.  She has failed oral steroids muscle relaxants gabapentin, epidural steroid injections.  We discussed single level cervical fusion at C3-4 anterior approach.  Risk surgery discussed.  Dysphagia dysphonia.  Surgical treatment options for pseudoarthrosis if it becomes increasingly symptomatic.  Follow-Up Instructions: No follow-ups on file.   Orders:  Orders Placed This Encounter  Procedures   XR Lumbar Spine 2-3 Views   No orders of the defined types were placed in this encounter.     Procedures: No procedures performed   Clinical Data: No additional findings.   Subjective: Chief Complaint  Patient presents with   Neck - Pain, Follow-up    MRI cervical spine review    HPI 65 year old female is been seen by Dr. August Saucer with persistent problems with neck pain and left hand cramping.  Pain in the left as well as right side.  Gabapentin helped some but she felt is not strong enough.  No relief with epidurals.  No relief with oral steroids.  Previous two-level fusion C5-6 and C6-7.CT scan 02/17/2021 showed pseudoarthrosis at C6-7.  C5-6 appears solid.  Previous cervical fusion done in [place].   Patient reported some relief with right glenohumeral injection.  Review of Systems negative for fever chills no myelopathic symptoms all other systems noncontributory to HPI.   Objective: Vital Signs: BP 115/78   Pulse 85   Ht 4\' 11"  (1.499 m)   Wt 132 lb (59.9 kg)   BMI 26.66 kg/m   Physical Exam Constitutional:      Appearance: She is well-developed.  HENT:     Head: Normocephalic.     Right Ear: External ear normal.     Left Ear: External ear normal. There is no impacted cerumen.  Eyes:     Pupils: Pupils are equal, round, and reactive to light.  Neck:     Thyroid: No thyromegaly.     Trachea: No tracheal deviation.  Cardiovascular:     Rate and Rhythm: Normal rate.  Pulmonary:     Effort: Pulmonary effort is normal.  Abdominal:     Palpations: Abdomen is soft.  Musculoskeletal:     Cervical back: No rigidity.  Skin:    General: Skin is warm and dry.  Neurological:     Mental Status: She is alert and oriented to person, place, and time.  Psychiatric:        Behavior: Behavior normal.    Ortho Exam healed lumbar incision L4-S1.  Mild sciatic notch tenderness right and left.  Knee and ankle jerk are intact.  Some increased pain with cervical flexion some pain with extension.  Specialty Comments:  No specialty comments available.  Imaging: CLINICAL DATA:  MR cervical spine eval for source of radicular arm pain   EXAM: MRI CERVICAL SPINE WITHOUT CONTRAST   TECHNIQUE: Multiplanar, multisequence MR imaging of the cervical spine was performed. No intravenous contrast was administered.   COMPARISON:  CT of the cervical spine 02/15/2021.   FINDINGS: Alignment: Straightening of the normal cervical lordosis. No substantial sagittal subluxation.   Vertebrae: Vertebral body heights are maintained. No focal marrow edema to suggest acute fracture discitis/osteomyelitis. C5-C7 ACDF.   Cord: Normal cord signal.   Posterior Fossa, vertebral arteries, paraspinal  tissues: Visualized vertebral artery flow voids are maintained. Unremarkable visualized posterior fossa. No paraspinal edema.   Disc levels:   C2-C3: No significant disc protrusion, foraminal stenosis, or canal stenosis.   C3-C4: Right paracentral disc protrusion deforms the right ventral cord with moderate canal stenosis. Left greater than right facet and uncovertebral hypertrophy with moderate left foraminal stenosis. No significant right foraminal stenosis.   C4-C5: Posterior disc osteophyte complex with central annular fissure. Mild canal stenosis. Left greater than right facet and uncovertebral degenerative with moderate left and mild right foraminal stenosis.   C5-C6: ACDF. Right greater than left facet and uncovertebral hypertrophy. Moderate right foraminal stenosis. No significant canal or left foraminal stenosis.   C6-C7: ACDF. Slight disc bulging. Right greater than left facet and uncovertebral hypertrophy. Resulting moderate right foraminal stenosis. No significant canal or left foraminal stenosis.   C7-T1: No significant disc protrusion, foraminal stenosis, or canal stenosis.   IMPRESSION: 1. At C3-C4, right paracentral disc protrusion deforms the right ventral cord with moderate canal stenosis. Moderate left foraminal stenosis. 2. At C4-C5, mild canal stenosis with moderate left and mild right foraminal stenosis. 3. C5-C7 ACDF with moderate right foraminal stenosis at C5-C6 and C6-C7. Patent canal.     Electronically Signed   By: Feliberto Harts M.D.   On: 05/11/2021 09:42   PMFS History: Patient Active Problem List   Diagnosis Date Noted   Cervical pseudoarthrosis (HCC) 03/20/2021   Vasomotor symptoms due to menopause 08/16/2019   Hypothyroidism 08/10/2019   Hyperlipidemia 08/10/2019   Past Medical History:  Diagnosis Date   Thyroid disease     Family History  Problem Relation Age of Onset   Cancer Mother     Past Surgical History:   Procedure Laterality Date   APPENDECTOMY     65 years old   CERVICAL FUSION  2020   C5-6 ACDF   JOINT REPLACEMENT Bilateral    Hips   LUMBAR SPINE SURGERY  2020   [facility] [place]   THYROIDECTOMY     Social History   Occupational History   Not on file  Tobacco Use   Smoking status: Light Smoker    Types: Cigarettes   Smokeless tobacco: Never  Vaping Use   Vaping Use: Never used  Substance and Sexual Activity   Alcohol use: Not on file    Comment: socially   Drug use: Not Currently   Sexual activity: Not on file

## 2021-06-04 DIAGNOSIS — Z9889 Other specified postprocedural states: Secondary | ICD-10-CM | POA: Insufficient documentation

## 2021-06-04 NOTE — Addendum Note (Signed)
Addended by: Eldred Manges on: 06/04/2021 06:35 PM   Modules accepted: Level of Service

## 2021-06-12 ENCOUNTER — Encounter: Payer: Self-pay | Admitting: Family Medicine

## 2021-06-12 ENCOUNTER — Other Ambulatory Visit: Payer: Self-pay

## 2021-06-12 ENCOUNTER — Ambulatory Visit (INDEPENDENT_AMBULATORY_CARE_PROVIDER_SITE_OTHER): Payer: Medicare Other | Admitting: Family Medicine

## 2021-06-12 VITALS — BP 127/84 | HR 67 | Temp 97.7°F | Resp 16 | Wt 131.8 lb

## 2021-06-12 DIAGNOSIS — E039 Hypothyroidism, unspecified: Secondary | ICD-10-CM

## 2021-06-12 DIAGNOSIS — F419 Anxiety disorder, unspecified: Secondary | ICD-10-CM | POA: Diagnosis not present

## 2021-06-12 DIAGNOSIS — Z23 Encounter for immunization: Secondary | ICD-10-CM | POA: Diagnosis not present

## 2021-06-12 DIAGNOSIS — G8929 Other chronic pain: Secondary | ICD-10-CM

## 2021-06-12 DIAGNOSIS — M25511 Pain in right shoulder: Secondary | ICD-10-CM | POA: Diagnosis not present

## 2021-06-12 DIAGNOSIS — F32A Depression, unspecified: Secondary | ICD-10-CM

## 2021-06-12 MED ORDER — LEVOTHYROXINE SODIUM 100 MCG PO TABS: 100.0000 ug | ORAL_TABLET | Freq: Every day | ORAL | 1 refills | Status: DC

## 2021-06-12 MED ORDER — GABAPENTIN 600 MG PO TABS: 600.0000 mg | ORAL_TABLET | Freq: Three times a day (TID) | ORAL | 2 refills | Status: DC

## 2021-06-12 MED ORDER — LUBIPROSTONE 24 MCG PO CAPS: 24.0000 ug | ORAL_CAPSULE | Freq: Two times a day (BID) | ORAL | 1 refills | Status: DC

## 2021-06-12 MED ORDER — MIRTAZAPINE 15 MG PO TABS: 15.0000 mg | ORAL_TABLET | Freq: Every day | ORAL | 1 refills | Status: DC

## 2021-06-12 NOTE — Progress Notes (Signed)
Established Patient Office Visit  Subjective:  Patient ID: Tina Bartlett, female    DOB: Jun 04, 1956  Age: 65 y.o. MRN: [id number]  CC: No chief complaint on file.   HPI Tina Bartlett presents for follow up of anxiety and depression. Patient reports that she has not been taking her lexapro. Symptoms persist.   Past Medical History:  Diagnosis Date   Thyroid disease     Past Surgical History:  Procedure Laterality Date   APPENDECTOMY     65 years old   CERVICAL FUSION  2020   C5-6 ACDF   JOINT REPLACEMENT Bilateral    Hips   LUMBAR SPINE SURGERY  2020   [facility] [place]   THYROIDECTOMY      Family History  Problem Relation Age of Onset   Cancer Mother     Social History   Socioeconomic History   Marital status: Single    Spouse name: Not on file   Number of children: 0   Years of education: 13+   Highest education level: Not on file  Occupational History   Not on file  Tobacco Use   Smoking status: Light Smoker    Types: Cigarettes   Smokeless tobacco: Never  Vaping Use   Vaping Use: Never used  Substance and Sexual Activity   Alcohol use: Not on file    Comment: socially   Drug use: Not Currently   Sexual activity: Not on file  Other Topics Concern   Not on file  Social History Narrative   Not on file   Social Determinants of Health   Financial Resource Strain: Not on file  Food Insecurity: Not on file  Transportation Needs: Not on file  Physical Activity: Not on file  Stress: Not on file  Social Connections: Not on file  Intimate Partner Violence: Not on file    ROS Review of Systems  Psychiatric/Behavioral:  Positive for sleep disturbance. Negative for self-injury and suicidal ideas. The patient is nervous/anxious.   All other systems reviewed and are negative.  Objective:   Today's Vitals: BP 127/84   Pulse 67   Temp 97.7 F (36.5 C) (Oral)   Resp 16   Wt 131 lb 12.8 oz (59.8 kg)   SpO2 97%   BMI 26.62 kg/m    Physical Exam Vitals and nursing note reviewed.  Constitutional:      General: She is not in acute distress. Cardiovascular:     Rate and Rhythm: Normal rate and regular rhythm.  Pulmonary:     Effort: Pulmonary effort is normal.     Breath sounds: Normal breath sounds.  Abdominal:     Palpations: Abdomen is soft.     Tenderness: There is no abdominal tenderness.  Musculoskeletal:     Right lower leg: No edema.     Left lower leg: No edema.  Neurological:     General: No focal deficit present.     Mental Status: She is alert and oriented to person, place, and time.  Psychiatric:        Mood and Affect: Mood is anxious. Affect is tearful.        Speech: Speech normal.        Thought Content: Thought content normal.    Assessment & Plan:   1. Anxiety and depression Remeron 15 mg prescribed. Compliance discussed. Will monitor.   2. Hypothyroidism, unspecified type Appears stable. Continue and monitor  - levothyroxine (SYNTHROID) 100 MCG tablet; Take 1 tablet (100 mcg  total) by mouth daily.  Dispense: 90 tablet; Refill: 1  3. Chronic right shoulder pain Neurontin refilled. Keep scheduled appt with consultant for further eval/mgt  - gabapentin (NEURONTIN) 600 MG tablet; Take 1 tablet (600 mg total) by mouth 3 (three) times daily.  Dispense: 90 tablet; Refill: 2  4. Need for shingles vaccine  - Varicella-zoster vaccine IM    Outpatient Encounter Medications as of 06/12/2021  Medication Sig   baclofen (LIORESAL) 20 MG tablet Take 1 tablet (20 mg total) by mouth daily.   cholecalciferol (VITAMIN D3) 25 MCG (1000 UNIT) tablet Take 1,000 Units by mouth daily.   lidocaine (XYLOCAINE) 5 % ointment Apply 1 application topically as needed.   mirtazapine (REMERON) 15 MG tablet Take 1 tablet (15 mg total) by mouth at bedtime.   pantoprazole (PROTONIX) 40 MG tablet Take 1 tablet (40 mg total) by mouth daily.   PREMARIN 0.3 MG tablet Take 0.3 mg by mouth daily.   simvastatin  (ZOCOR) 40 MG tablet Take 1 tablet (40 mg total) by mouth daily.   [DISCONTINUED] gabapentin (NEURONTIN) 600 MG tablet Take 1 tablet (600 mg total) by mouth 3 (three) times daily.   [DISCONTINUED] levothyroxine (SYNTHROID) 100 MCG tablet Take 1 tablet (100 mcg total) by mouth daily.   [DISCONTINUED] lubiprostone (AMITIZA) 24 MCG capsule Take 1 capsule (24 mcg total) by mouth 2 (two) times daily with a meal.   gabapentin (NEURONTIN) 600 MG tablet Take 1 tablet (600 mg total) by mouth 3 (three) times daily.   levothyroxine (SYNTHROID) 100 MCG tablet Take 1 tablet (100 mcg total) by mouth daily.   lubiprostone (AMITIZA) 24 MCG capsule Take 1 capsule (24 mcg total) by mouth 2 (two) times daily with a meal.   [DISCONTINUED] methylPREDNISolone (MEDROL DOSEPAK) 4 MG TBPK tablet Take dosepak as directed (Patient not taking: Reported on 05/29/2021)   [DISCONTINUED] PARoxetine (PAXIL) 30 MG tablet Take 1 tablet (30 mg total) by mouth daily.   [DISCONTINUED] traMADol (ULTRAM) 50 MG tablet Take 1 tablet (50 mg total) by mouth every 12 (twelve) hours as needed. (Patient not taking: Reported on 05/29/2021)   No facility-administered encounter medications on file as of 06/12/2021.    Follow-up: Return in about 4 weeks (around 07/10/2021) for follow up, chronic med issues.   [name], MD

## 2021-06-13 ENCOUNTER — Other Ambulatory Visit: Payer: Self-pay | Admitting: Internal Medicine

## 2021-06-13 ENCOUNTER — Other Ambulatory Visit: Payer: Self-pay

## 2021-06-13 DIAGNOSIS — E785 Hyperlipidemia, unspecified: Secondary | ICD-10-CM

## 2021-06-13 NOTE — Progress Notes (Signed)
Simvastatin has been refilled for 90 day supply

## 2021-06-19 ENCOUNTER — Telehealth: Payer: Self-pay | Admitting: Orthopaedic Surgery

## 2021-06-19 NOTE — Telephone Encounter (Signed)
I left voicemail for patient advising. 

## 2021-06-19 NOTE — Telephone Encounter (Signed)
Pt asking for a call back from Dr. Ophelia Charter or his nurse specifically. Pt stated she is sch'd to have surg in Jan of next year but pt wanted to verify with provider that is was okay for her to wait until Springtime to have to surg completed. Pt stated she is having a personal issue with her house and wanted to wait until that is resolved to do the surg. The best call back number is [phone].

## 2021-06-19 NOTE — Telephone Encounter (Signed)
Can you please advise if ok to wait until spring for surgery?

## 2021-08-12 IMAGING — DX DG SHOULDER 2+V*R*
3 series · 3 of 3 positions shown · non-contrast
Comparison: None.

CLINICAL DATA: Shoulder pain after fall 1 week ago

EXAM:
RIGHT SHOULDER - 2+ VIEW

[shoulder neutral ap]
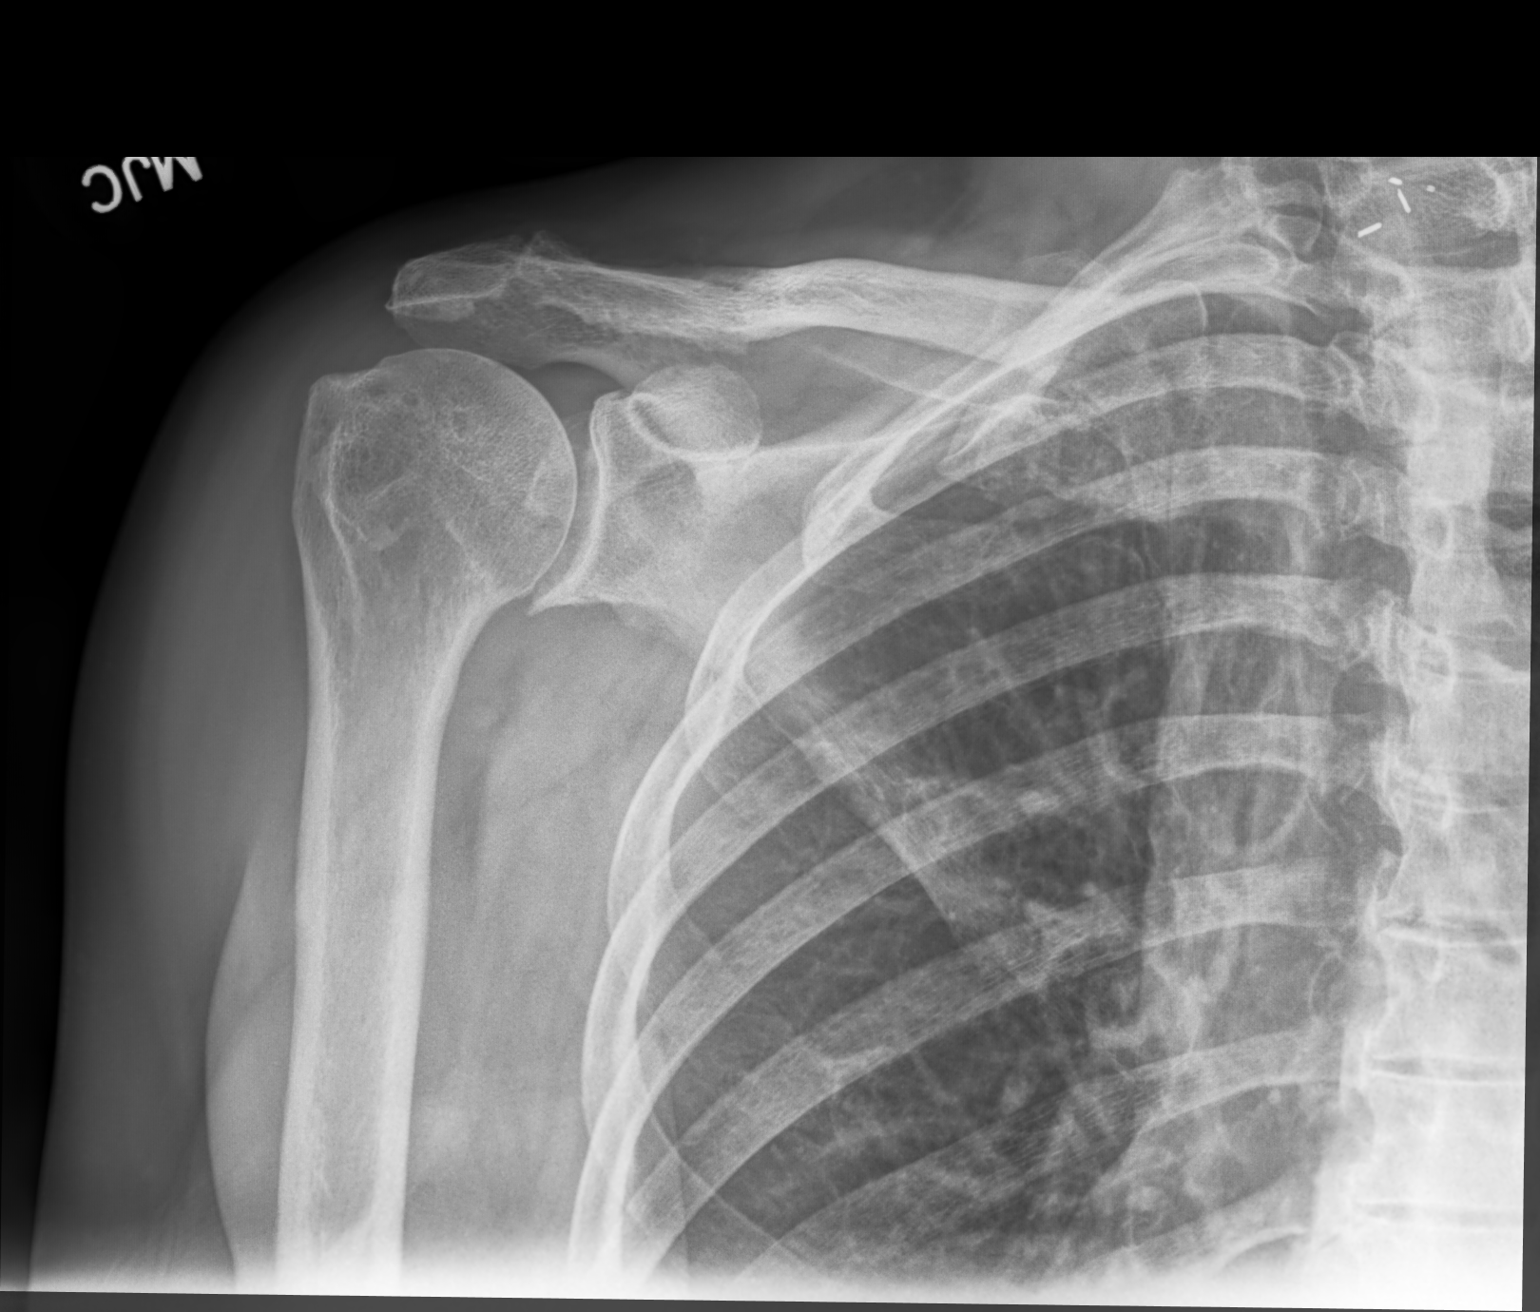

[shoulder transscapular y view (neer)]
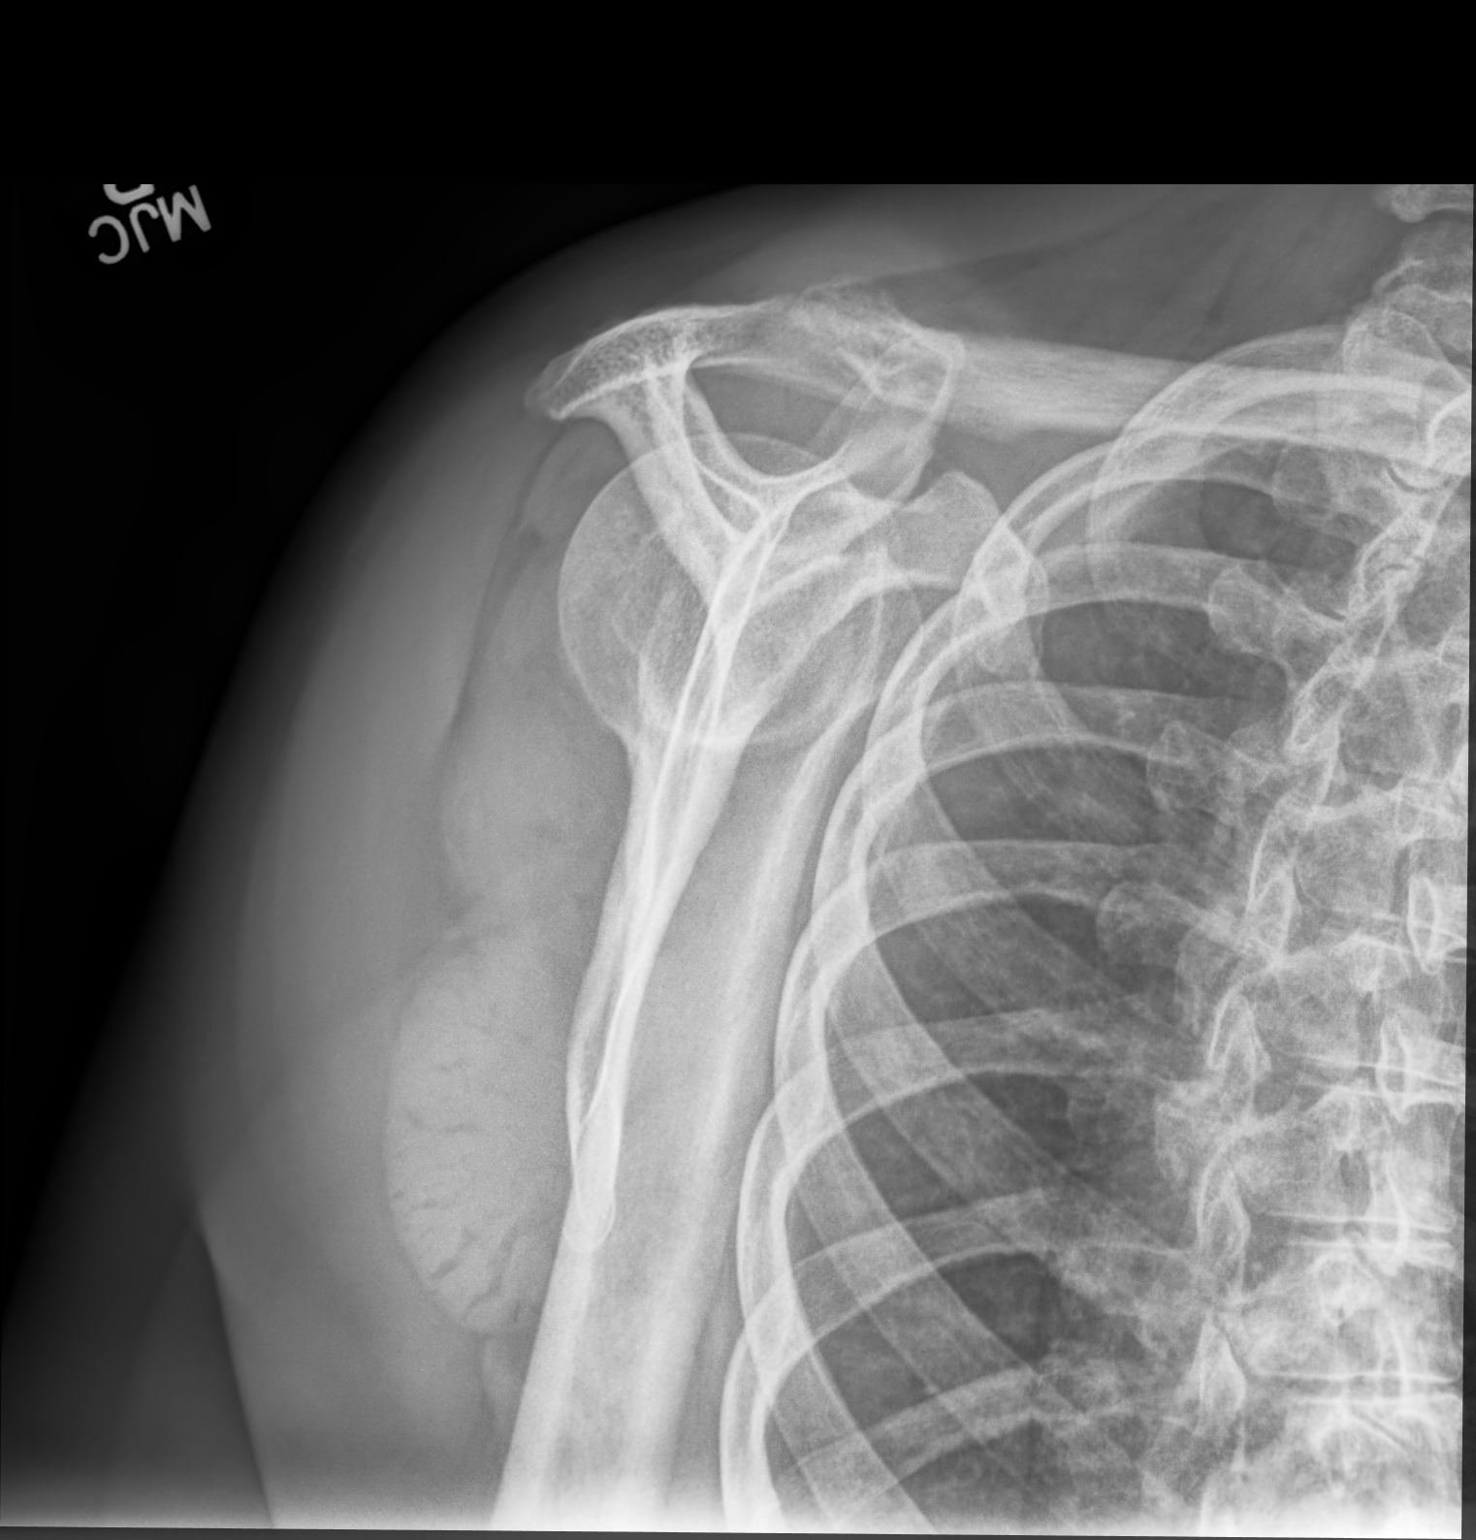

[shoulder axial 5° seated]
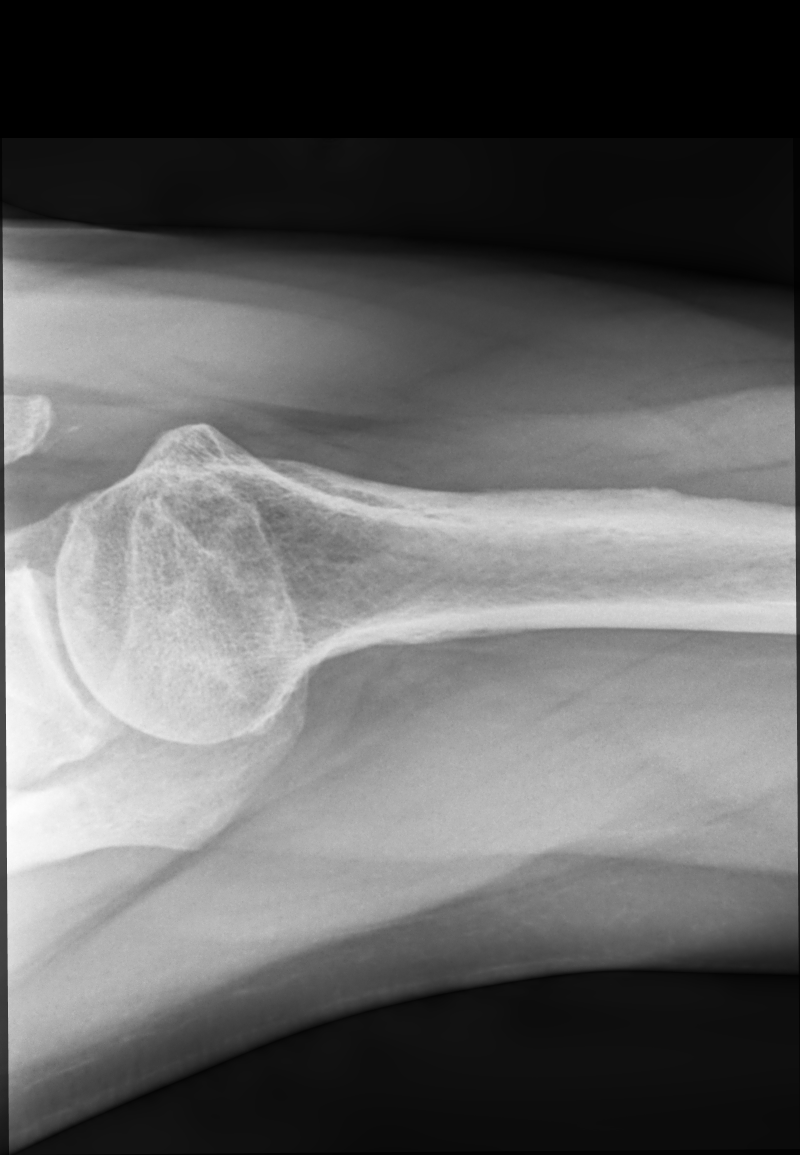

[3 of 3 positions shown; findings below may reference images not displayed]

FINDINGS: No fracture or malalignment. Mild AC joint degenerative change. Mild
glenohumeral degenerative change. Right lung apex is clear.
IMPRESSION: No acute osseous abnormality.

## 2021-09-10 ENCOUNTER — Other Ambulatory Visit: Payer: Self-pay

## 2021-09-11 ENCOUNTER — Ambulatory Visit (INDEPENDENT_AMBULATORY_CARE_PROVIDER_SITE_OTHER): Payer: Medicare Other | Admitting: Family Medicine

## 2021-09-11 ENCOUNTER — Other Ambulatory Visit: Payer: Self-pay

## 2021-09-11 ENCOUNTER — Encounter: Payer: Self-pay | Admitting: Family Medicine

## 2021-09-11 VITALS — BP 106/69 | HR 58 | Temp 98.1°F | Resp 16 | Wt 129.8 lb

## 2021-09-11 DIAGNOSIS — F419 Anxiety disorder, unspecified: Secondary | ICD-10-CM

## 2021-09-11 DIAGNOSIS — F32A Depression, unspecified: Secondary | ICD-10-CM | POA: Diagnosis not present

## 2021-09-11 DIAGNOSIS — M545 Low back pain, unspecified: Secondary | ICD-10-CM

## 2021-09-11 DIAGNOSIS — K219 Gastro-esophageal reflux disease without esophagitis: Secondary | ICD-10-CM | POA: Diagnosis not present

## 2021-09-11 MED ORDER — PANTOPRAZOLE SODIUM 40 MG PO TBEC: 40.0000 mg | DELAYED_RELEASE_TABLET | Freq: Every day | ORAL | 0 refills | Status: DC

## 2021-09-11 MED ORDER — TRAMADOL HCL 50 MG PO TABS: 50.0000 mg | ORAL_TABLET | Freq: Four times a day (QID) | ORAL | 0 refills | Status: AC | PRN

## 2021-09-11 MED ORDER — TRIAMCINOLONE ACETONIDE 40 MG/ML IJ SUSP
40.0000 mg | Freq: Once | INTRAMUSCULAR | Status: AC
  Administered 2021-09-11: 40 mg via INTRAMUSCULAR

## 2021-09-11 MED ORDER — MIRTAZAPINE 15 MG PO TABS: 15.0000 mg | ORAL_TABLET | Freq: Every day | ORAL | 1 refills | Status: DC

## 2021-09-11 NOTE — Progress Notes (Signed)
Established Patient Office Visit  Subjective:  Patient ID: Tina Bartlett, female    DOB: 1955/12/26  Age: 66 y.o. MRN: [id number]  CC:  Chief Complaint  Patient presents with   Medication Refill   Back Pain    HPI Presley Armon presents for follow up of chronic med issues including depression/anxiety and GERD. Patient also reports back pain that has not been improved with present management. Denies known trauma or injury.   Past Medical History:  Diagnosis Date   Thyroid disease     Past Surgical History:  Procedure Laterality Date   APPENDECTOMY     66 years old   CERVICAL FUSION  2020   C5-6 ACDF   JOINT REPLACEMENT Bilateral    Hips   LUMBAR SPINE SURGERY  2020   [facility] [place]   THYROIDECTOMY      Family History  Problem Relation Age of Onset   Cancer Mother     Social History   Socioeconomic History   Marital status: Single    Spouse name: Not on file   Number of children: 0   Years of education: 13+   Highest education level: Not on file  Occupational History   Not on file  Tobacco Use   Smoking status: Light Smoker    Types: Cigarettes   Smokeless tobacco: Never  Vaping Use   Vaping Use: Never used  Substance and Sexual Activity   Alcohol use: Not on file    Comment: socially   Drug use: Not Currently   Sexual activity: Not on file  Other Topics Concern   Not on file  Social History Narrative   Not on file   Social Determinants of Health   Financial Resource Strain: Not on file  Food Insecurity: Not on file  Transportation Needs: Not on file  Physical Activity: Not on file  Stress: Not on file  Social Connections: Not on file  Intimate Partner Violence: Not on file    ROS Review of Systems  Musculoskeletal:  Positive for back pain.  All other systems reviewed and are negative.  Objective:   Today's Vitals: BP 106/69    Pulse (!) 58    Temp 98.1 F (36.7 C) (Oral)    Resp 16    Wt 129 lb 12.8 oz (58.9 kg)    SpO2  94%    BMI 26.22 kg/m   Physical Exam Vitals and nursing note reviewed.  Constitutional:      General: She is not in acute distress. Cardiovascular:     Rate and Rhythm: Normal rate and regular rhythm.  Pulmonary:     Effort: Pulmonary effort is normal.     Breath sounds: Normal breath sounds.  Abdominal:     Palpations: Abdomen is soft.     Tenderness: There is no abdominal tenderness.  Musculoskeletal:     Lumbar back: Spasms and tenderness present. No deformity. Decreased range of motion.  Neurological:     General: No focal deficit present.     Mental Status: She is alert and oriented to person, place, and time.    Assessment & Plan:   1. Bilateral low back pain without sciatica, unspecified chronicity Kenalog IM injection given. Tramadol prescribed. Tylenol/nsaids/topical preps prn - triamcinolone acetonide (KENALOG-40) injection 40 mg  2. Gastroesophageal reflux disease, unspecified whether esophagitis present Continue present management. Meds refilled.  - pantoprazole (PROTONIX) 40 MG tablet; Take 1 tablet (40 mg total) by mouth daily.  Dispense: 90 tablet; Refill:  0  3. Anxiety and depression Appears stable. Meds refill. Continue present management and monitor    Outpatient Encounter Medications as of 09/11/2021  Medication Sig   baclofen (LIORESAL) 20 MG tablet Take 1 tablet (20 mg total) by mouth daily.   cholecalciferol (VITAMIN D3) 25 MCG (1000 UNIT) tablet Take 1,000 Units by mouth daily.   diazepam (VALIUM) 2 MG tablet PLEASE SEE ATTACHED FOR DETAILED DIRECTIONS   gabapentin (NEURONTIN) 600 MG tablet Take 1 tablet (600 mg total) by mouth 3 (three) times daily.   levothyroxine (SYNTHROID) 100 MCG tablet Take 1 tablet (100 mcg total) by mouth daily.   lidocaine (XYLOCAINE) 5 % ointment Apply 1 application topically as needed.   lubiprostone (AMITIZA) 24 MCG capsule Take 1 capsule (24 mcg total) by mouth 2 (two) times daily with a meal.   PREMARIN 0.3 MG tablet  Take 0.3 mg by mouth daily.   simvastatin (ZOCOR) 40 MG tablet TAKE 1 TABLET BY MOUTH EVERY DAY   traMADol (ULTRAM) 50 MG tablet Take 1 tablet (50 mg total) by mouth every 6 (six) hours as needed for up to 5 days for severe pain.   [DISCONTINUED] mirtazapine (REMERON) 15 MG tablet Take 1 tablet (15 mg total) by mouth at bedtime.   [DISCONTINUED] pantoprazole (PROTONIX) 40 MG tablet Take 1 tablet (40 mg total) by mouth daily.   mirtazapine (REMERON) 15 MG tablet Take 1 tablet (15 mg total) by mouth at bedtime.   pantoprazole (PROTONIX) 40 MG tablet Take 1 tablet (40 mg total) by mouth daily.   [EXPIRED] triamcinolone acetonide (KENALOG-40) injection 40 mg    No facility-administered encounter medications on file as of 09/11/2021.    Follow-up: No follow-ups on file.   [name], MD

## 2021-09-11 NOTE — Progress Notes (Signed)
Patient here for medication refill.   Patient has back pain that has not gotten any better. Patient said her medication is not relieving pain

## 2021-09-26 ENCOUNTER — Telehealth: Payer: Self-pay

## 2021-09-26 NOTE — Telephone Encounter (Signed)
Patient called and cancelled cervical spine surgery for 11-02-21.  Her sister passed away unexpectedly with a massive MI.  She will contact us back when she is ready to reschedule surgery. ?

## 2021-10-03 ENCOUNTER — Ambulatory Visit: Payer: Medicare Other | Admitting: Surgery

## 2021-10-05 ENCOUNTER — Ambulatory Visit: Payer: Medicare Other

## 2021-10-08 ENCOUNTER — Ambulatory Visit: Admit: 2021-10-08 | Payer: Medicare Other | Admitting: Orthopaedic Surgery

## 2021-10-08 SURGERY — ANTERIOR CERVICAL DECOMPRESSION/DISCECTOMY FUSION 1 LEVEL
Anesthesia: General

## 2021-10-16 ENCOUNTER — Other Ambulatory Visit: Payer: Self-pay | Admitting: *Deleted

## 2021-10-16 ENCOUNTER — Encounter: Payer: Medicare Other | Admitting: Orthopaedic Surgery

## 2021-10-16 DIAGNOSIS — G8929 Other chronic pain: Secondary | ICD-10-CM

## 2021-10-16 MED ORDER — GABAPENTIN 600 MG PO TABS: 600.0000 mg | ORAL_TABLET | Freq: Three times a day (TID) | ORAL | 0 refills | Status: DC

## 2021-11-06 ENCOUNTER — Telehealth: Payer: Self-pay | Admitting: *Deleted

## 2021-11-06 NOTE — Telephone Encounter (Signed)
Medication request ?mirtazapine (REMERON) 15 MG tablet [[id number]]  ?

## 2021-11-07 ENCOUNTER — Other Ambulatory Visit: Payer: Self-pay | Admitting: Family Medicine

## 2021-11-08 DIAGNOSIS — G4733 Obstructive sleep apnea (adult) (pediatric): Secondary | ICD-10-CM | POA: Diagnosis not present

## 2021-11-08 DIAGNOSIS — G4752 REM sleep behavior disorder: Secondary | ICD-10-CM | POA: Diagnosis not present

## 2021-11-14 ENCOUNTER — Other Ambulatory Visit: Payer: Self-pay | Admitting: Family Medicine

## 2021-11-14 DIAGNOSIS — G8929 Other chronic pain: Secondary | ICD-10-CM

## 2021-11-14 DIAGNOSIS — K219 Gastro-esophageal reflux disease without esophagitis: Secondary | ICD-10-CM

## 2021-11-16 MED ORDER — GABAPENTIN 600 MG PO TABS: 600.0000 mg | ORAL_TABLET | Freq: Three times a day (TID) | ORAL | 0 refills | Status: DC

## 2021-11-16 MED ORDER — VITAMIN D 25 MCG (1000 UNIT) PO TABS: 1000.0000 [IU] | ORAL_TABLET | Freq: Every day | ORAL | 0 refills | Status: DC

## 2021-11-16 MED ORDER — PANTOPRAZOLE SODIUM 40 MG PO TBEC: 40.0000 mg | DELAYED_RELEASE_TABLET | Freq: Every day | ORAL | 0 refills | Status: DC

## 2021-11-27 ENCOUNTER — Ambulatory Visit (INDEPENDENT_AMBULATORY_CARE_PROVIDER_SITE_OTHER): Payer: Medicare Other | Admitting: Family Medicine

## 2021-11-27 ENCOUNTER — Encounter: Payer: Self-pay | Admitting: Family Medicine

## 2021-11-27 VITALS — BP 117/81 | HR 61 | Temp 98.1°F | Resp 16 | Wt 124.8 lb

## 2021-11-27 DIAGNOSIS — G4733 Obstructive sleep apnea (adult) (pediatric): Secondary | ICD-10-CM | POA: Diagnosis not present

## 2021-11-27 DIAGNOSIS — F419 Anxiety disorder, unspecified: Secondary | ICD-10-CM

## 2021-11-27 DIAGNOSIS — K219 Gastro-esophageal reflux disease without esophagitis: Secondary | ICD-10-CM

## 2021-11-27 DIAGNOSIS — G4752 REM sleep behavior disorder: Secondary | ICD-10-CM | POA: Insufficient documentation

## 2021-11-27 DIAGNOSIS — R400 Somnolence: Secondary | ICD-10-CM | POA: Insufficient documentation

## 2021-11-27 DIAGNOSIS — F32A Depression, unspecified: Secondary | ICD-10-CM

## 2021-11-27 DIAGNOSIS — G4709 Other insomnia: Secondary | ICD-10-CM | POA: Diagnosis not present

## 2021-11-27 DIAGNOSIS — E039 Hypothyroidism, unspecified: Secondary | ICD-10-CM

## 2021-11-27 DIAGNOSIS — E785 Hyperlipidemia, unspecified: Secondary | ICD-10-CM | POA: Diagnosis not present

## 2021-11-27 MED ORDER — PANTOPRAZOLE SODIUM 40 MG PO TBEC: 40.0000 mg | DELAYED_RELEASE_TABLET | Freq: Every day | ORAL | 1 refills | Status: DC

## 2021-11-27 MED ORDER — BACLOFEN 20 MG PO TABS: 20.0000 mg | ORAL_TABLET | Freq: Every day | ORAL | 1 refills | Status: DC

## 2021-11-27 MED ORDER — SIMVASTATIN 40 MG PO TABS: 40.0000 mg | ORAL_TABLET | Freq: Every day | ORAL | 1 refills | Status: DC

## 2021-11-27 MED ORDER — LEVOTHYROXINE SODIUM 100 MCG PO TABS: 100.0000 ug | ORAL_TABLET | Freq: Every day | ORAL | 1 refills | Status: DC

## 2021-11-27 NOTE — Progress Notes (Signed)
? ?Established Patient Office Visit ? ?Subjective   ? ?Patient ID: Tina Bartlett, female    DOB: February 12, 1956  Age: 66 y.o. MRN: [id number] ? ?CC:  ?Chief Complaint  ?Patient presents with  ? Medication Refill  ? ? ?HPI ?Tina Bartlett presents for routine follow up of chronic med issues. Patient reports that she has stopped the remeron 2/2 hallucinations. She is scheduled for a sleep study in about 2 weeks.  ? ? ?Outpatient Encounter Medications as of 11/27/2021  ?Medication Sig  ? baclofen (LIORESAL) 20 MG tablet 1 tablet Administer without regards to meals as needed  ? cholecalciferol (VITAMIN D3) 25 MCG (1000 UNIT) tablet Take 1 tablet (1,000 Units total) by mouth daily.  ? gabapentin (NEURONTIN) 600 MG tablet Take 1 tablet (600 mg total) by mouth 3 (three) times daily.  ? levothyroxine (SYNTHROID) 100 MCG tablet Take 1 tablet (100 mcg total) by mouth daily.  ? lubiprostone (AMITIZA) 24 MCG capsule Take 1 capsule (24 mcg total) by mouth 2 (two) times daily with a meal.  ? Multiple Vitamins-Minerals (MULTIVITAMIN WITH IRON-MINERALS) liquid See admin instructions.  ? Omega 3 1200 MG CAPS 1 capsule  ? pantoprazole (PROTONIX) 40 MG tablet Take 1 tablet (40 mg total) by mouth daily.  ? PREMARIN 0.3 MG tablet Take 0.3 mg by mouth daily.  ? simvastatin (ZOCOR) 40 MG tablet TAKE 1 TABLET BY MOUTH EVERY DAY  ? valACYclovir (VALTREX) 500 MG tablet Take by mouth.  ? diazepam (VALIUM) 2 MG tablet PLEASE SEE ATTACHED FOR DETAILED DIRECTIONS (Patient not taking: Reported on 11/27/2021)  ? lidocaine (XYLOCAINE) 5 % ointment Apply 1 application topically as needed. (Patient not taking: Reported on 11/27/2021)  ? [DISCONTINUED] baclofen (LIORESAL) 20 MG tablet Take 1 tablet (20 mg total) by mouth daily.  ? [DISCONTINUED] gabapentin (NEURONTIN) 600 MG tablet 1 tablet  ? [DISCONTINUED] levothyroxine (SYNTHROID) 100 MCG tablet 1 tablet in the morning on an empty stomach  ? [DISCONTINUED] mirtazapine (REMERON) 15 MG tablet Take 1 tablet (15 mg  total) by mouth at bedtime. (Patient not taking: Reported on 11/27/2021)  ? [DISCONTINUED] pantoprazole (PROTONIX) 40 MG tablet 1 tablet  ? [DISCONTINUED] simvastatin (ZOCOR) 40 MG tablet 1 tablet in the evening  ? ?No facility-administered encounter medications on file as of 11/27/2021.  ? ? ?Past Medical History:  ?Diagnosis Date  ? Thyroid disease   ? ? ?Past Surgical History:  ?Procedure Laterality Date  ? APPENDECTOMY    ? 66 years old  ? CERVICAL FUSION  2020  ? C5-6 ACDF  ? JOINT REPLACEMENT Bilateral   ? Hips  ? LUMBAR SPINE SURGERY  2020  ? [facility] [place]  ? THYROIDECTOMY    ? ? ?Family History  ?Problem Relation Age of Onset  ? Cancer Mother   ? ? ?Social History  ? ?Socioeconomic History  ? Marital status: Single  ?  Spouse name: Not on file  ? Number of children: 0  ? Years of education: 13+  ? Highest education level: Not on file  ?Occupational History  ? Not on file  ?Tobacco Use  ? Smoking status: Light Smoker  ?  Types: Cigarettes  ? Smokeless tobacco: Never  ?Vaping Use  ? Vaping Use: Never used  ?Substance and Sexual Activity  ? Alcohol use: Not on file  ?  Comment: socially  ? Drug use: Not Currently  ? Sexual activity: Not on file  ?Other Topics Concern  ? Not on file  ?Social History Narrative  ?  Not on file  ? ?Social Determinants of Health  ? ?Financial Resource Strain: Not on file  ?Food Insecurity: Not on file  ?Transportation Needs: Not on file  ?Physical Activity: Not on file  ?Stress: Not on file  ?Social Connections: Not on file  ?Intimate Partner Violence: Not on file  ? ? ?Review of Systems  ?All other systems reviewed and are negative. ? ?  ? ? ?Objective   ? ?BP 117/81   Pulse 61   Temp 98.1 ?F (36.7 ?C) (Oral)   Resp 16   Wt 124 lb 12.8 oz (56.6 kg)   BMI 25.21 kg/m?  ? ?Physical Exam ?Vitals and nursing note reviewed.  ?Constitutional:   ?   General: She is not in acute distress. ?Cardiovascular:  ?   Rate and Rhythm: Normal rate and regular rhythm.  ?Pulmonary:  ?    Effort: Pulmonary effort is normal.  ?   Breath sounds: Normal breath sounds.  ?Abdominal:  ?   Palpations: Abdomen is soft.  ?   Tenderness: There is no abdominal tenderness.  ?Musculoskeletal:  ?   Right lower leg: No edema.  ?   Left lower leg: No edema.  ?Neurological:  ?   General: No focal deficit present.  ?   Mental Status: She is alert and oriented to person, place, and time.  ?Psychiatric:     ?   Mood and Affect: Mood is anxious. Affect is tearful.     ?   Speech: Speech normal.     ?   Thought Content: Thought content normal.  ? ? ? ?  ? ?Assessment & Plan:  ? ?Problem List Items Addressed This Visit   ? ?  ? Respiratory  ? Obstructive sleep apnea syndrome  ? ?Other Visit Diagnoses   ? ? Anxiety and depression    -  Primary  ? Other insomnia      ? Gastroesophageal reflux disease, unspecified whether esophagitis present      ? ?  ? ? ?No follow-ups on file.  ? ?[name], MD ? ? ?

## 2022-01-18 ENCOUNTER — Ambulatory Visit (INDEPENDENT_AMBULATORY_CARE_PROVIDER_SITE_OTHER): Payer: 59

## 2022-01-18 DIAGNOSIS — Z0000 Encounter for general adult medical examination without abnormal findings: Secondary | ICD-10-CM

## 2022-01-18 NOTE — Progress Notes (Signed)
Subjective:   Tina Bartlett is a 66 y.o. female who presents for an Initial Medicare Annual Wellness Visit. I connected with  Tina Bartlett on 01/18/22 by a audio enabled telemedicine application and verified that I am speaking with the correct person using two identifiers.  Patient Location: Home  Provider Location: Home Office  I discussed the limitations of evaluation and management by telemedicine. The patient expressed understanding and agreed to proceed.     Objective:    Today's Vitals   01/18/22 1116 01/18/22 1117  PainSc: 8  8    There is no height or weight on file to calculate BMI.      No data to display          Current Medications (verified) Outpatient Encounter Medications as of 01/18/2022  Medication Sig   baclofen (LIORESAL) 20 MG tablet 1 tablet Administer without regards to meals as needed   baclofen (LIORESAL) 20 MG tablet Take 1 tablet (20 mg total) by mouth daily.   cholecalciferol (VITAMIN D3) 25 MCG (1000 UNIT) tablet Take 1 tablet (1,000 Units total) by mouth daily.   gabapentin (NEURONTIN) 600 MG tablet Take 1 tablet (600 mg total) by mouth 3 (three) times daily.   levothyroxine (SYNTHROID) 100 MCG tablet Take 1 tablet (100 mcg total) by mouth daily.   lubiprostone (AMITIZA) 24 MCG capsule Take 1 capsule (24 mcg total) by mouth 2 (two) times daily with a meal.   Multiple Vitamins-Minerals (MULTIVITAMIN WITH IRON-MINERALS) liquid See admin instructions.   Omega 3 1200 MG CAPS 1 capsule   pantoprazole (PROTONIX) 40 MG tablet Take 1 tablet (40 mg total) by mouth daily.   PREMARIN 0.3 MG tablet Take 0.3 mg by mouth daily.   simvastatin (ZOCOR) 40 MG tablet Take 1 tablet (40 mg total) by mouth daily.   valACYclovir (VALTREX) 500 MG tablet Take by mouth.   diazepam (VALIUM) 2 MG tablet PLEASE SEE ATTACHED FOR DETAILED DIRECTIONS (Patient not taking: Reported on 11/27/2021)   lidocaine (XYLOCAINE) 5 % ointment Apply 1 application topically as needed.  (Patient not taking: Reported on 11/27/2021)   No facility-administered encounter medications on file as of 01/18/2022.    Allergies (verified) Hydroxyzine, Nsaids, Tramadol, and Tylenol [acetaminophen]   History: Past Medical History:  Diagnosis Date   Thyroid disease    Past Surgical History:  Procedure Laterality Date   APPENDECTOMY     66 years old   CERVICAL FUSION  2020   C5-6 ACDF   JOINT REPLACEMENT Bilateral    Hips   LUMBAR SPINE SURGERY  2020   [place]     Family History  Problem Relation Age of Onset   Cancer Mother    Social History   Socioeconomic History   Marital status: Single    Spouse name: Not on file   Number of children: 0   Years of education: 13+   Highest education level: Not on file  Occupational History   Not on file  Tobacco Use   Smoking status: Light Smoker    Types: Cigarettes   Smokeless tobacco: Never  Vaping Use   Vaping Use: Never used  Substance and Sexual Activity   Alcohol use: Not on file    Comment: socially   Drug use: Not Currently   Sexual activity: Not on file  Other Topics Concern   Not on file  Social History Narrative   Not on file   Social Determinants of Health  Financial Resource Strain: Medium Risk (01/18/2022)   Overall Financial Resource Strain (CARDIA)    Difficulty of Paying Living Expenses: Somewhat hard  Food Insecurity: Food Insecurity Present (01/18/2022)   Hunger Vital Sign    Worried About Running Out of Food in the Last Year: Often true    Ran Out of Food in the Last Year: Often true  Transportation Needs: No Transportation Needs (01/18/2022)   PRAPARE - Hydrologist (Medical): No    Lack of Transportation (Non-Medical): No  Physical Activity: Insufficiently Active (01/18/2022)   Exercise Vital Sign    Days of Exercise per Week: 7 days    Minutes of Exercise per Session: 20 min  Stress: No Stress Concern Present (01/18/2022)    [place]    Feeling of Stress : Not at all  Social Connections: Moderately Isolated (01/18/2022)   Social Connection and Isolation Panel [NHANES]    Frequency of Communication with Friends and Family: More than three times a week    Frequency of Social Gatherings with Friends and Family: More than three times a week    Attends Religious Services: Never    Marine scientist or Organizations: No    Attends Music therapist: Never    Marital Status: Living with partner    Tobacco Counseling Ready to quit: Not Answered Counseling given: Yes   Clinical Intake:  Pre-visit preparation completed: Yes  Pain : 0-10 Pain Score: 8  Pain Type: Chronic pain Pain Location: Back Pain Orientation: Lower Pain Radiating Towards: lower back Pain Descriptors / Indicators: Constant Pain Onset: In the past 7 days Pain Frequency: Occasional Pain Relieving Factors: gabapentin  Pain Relieving Factors: gabapentin  Diabetes: No  How often do you need to have someone help you when you read instructions, pamphlets, or other written materials from your doctor or pharmacy?: 2 - Rarely What is the last grade level you completed in school?: 12th grade  Diabetic? No   Interpreter Needed?: No      Activities of Daily Living    01/18/2022   11:24 AM  In your present state of health, do you have any difficulty performing the following activities:  Hearing? 0  Vision? 1  Difficulty concentrating or making decisions? 0  Walking or climbing stairs? 0  Dressing or bathing? 0  Doing errands, shopping? 0    Patient Care Team: [name], MD as PCP - General (Family Medicine)  Indicate any recent Medical Services you may have received from other than [facility] providers in the past year (date may be approximate).     Assessment:   This is a routine wellness examination for Tina Bartlett.  Hearing/Vision screen No  results found.  Dietary issues and exercise activities discussed:     Goals Addressed   None   Depression Screen    01/18/2022   11:19 AM 11/27/2021    9:46 AM 09/11/2021    9:34 AM 06/12/2021    8:56 AM 03/12/2021    8:14 AM 11/22/2020    3:09 PM 08/04/2020   11:01 AM  PHQ 2/9 Scores  PHQ - 2 Score 0 0 0 3 2 0 0  PHQ- 9 Score 0 0 1 6 9  4     Fall Risk    01/18/2022   11:24 AM 03/12/2021    8:14 AM 11/22/2020    3:09 PM 02/08/2020   10:31 AM 08/16/2019   10:00 AM  Fall Risk   Falls in the past year? 1 0 0 0 0  Number falls in past yr: 0 0 0 0 0  Injury with Fall? 0 0 0 0 0  Risk for fall due to : No Fall Risks No Fall Risks No Fall Risks    Follow up Falls evaluation completed Falls evaluation completed       FALL RISK PREVENTION PERTAINING TO THE HOME:  Any stairs in or around the home? Yes  If so, are there any without handrails? No  Home free of loose throw rugs in walkways, pet beds, electrical cords, etc? Yes  Adequate lighting in your home to reduce risk of falls? Yes   ASSISTIVE DEVICES UTILIZED TO PREVENT FALLS:  Life alert? No  Use of a cane, walker or w/c? No  Grab bars in the bathroom? No  Shower chair or bench in shower? No  Elevated toilet seat or a handicapped toilet? No   Cognitive Function:        01/18/2022   11:26 AM  6CIT Screen  What Year? 0 points  What month? 0 points  What time? 0 points  Count back from 20 0 points  Months in reverse 2 points  Repeat phrase 0 points  Total Score 2 points    Immunizations Immunization History  Administered Date(s) Administered   Influenza,inj,Quad PF,6+ Mos 04/21/2019   Influenza-Unspecified 03/22/2020   PFIZER(Purple Top)SARS-COV-2 Vaccination 09/24/2019, 10/20/2019, 03/20/2020   Pneumococcal Polysaccharide-23 08/10/2019   Zoster Recombinat (Shingrix) 06/12/2021    TDAP status: Due, Education has been provided regarding the importance of this vaccine. Advised may receive this vaccine at local  pharmacy or Health Dept. Aware to provide a copy of the vaccination record if obtained from local pharmacy or Health Dept. Verbalized acceptance and understanding.  Flu Vaccine status: Up to date  Pneumococcal vaccine status: Due, Education has been provided regarding the importance of this vaccine. Advised may receive this vaccine at local pharmacy or Health Dept. Aware to provide a copy of the vaccination record if obtained from local pharmacy or Health Dept. Verbalized acceptance and understanding.  Covid-19 vaccine status: Completed vaccines  Qualifies for Shingles Vaccine? Yes   Zostavax completed No   Shingrix Completed?: No.    Education has been provided regarding the importance of this vaccine. Patient has been advised to call insurance company to determine out of pocket expense if they have not yet received this vaccine. Advised may also receive vaccine at local pharmacy or Health Dept. Verbalized acceptance and understanding.  Screening Tests Health Maintenance  Topic Date Due   COVID-19 Vaccine (4 - Pfizer series) 05/15/2020   Zoster Vaccines- Shingrix (2 of 2) 08/07/2021   PAP SMEAR-Modifier  07/21/2022 (Originally 01/22/1978)   TETANUS/TDAP  07/21/2022 (Originally 01/23/1976)   INFLUENZA VACCINE  02/19/2022   MAMMOGRAM  04/11/2023   COLONOSCOPY (Pts 45-88yrs Insurance coverage will need to be confirmed)  08/21/2028   Hepatitis C Screening  Completed   HIV Screening  Completed   HPV VACCINES  Aged Out    Health Maintenance  Health Maintenance Due  Topic Date Due   COVID-19 Vaccine (4 - Pfizer series) 05/15/2020   Zoster Vaccines- Shingrix (2 of 2) 08/07/2021    Colorectal cancer screening: Referral to GI placed provider notified. Pt aware the office will call re: appt.  Mammogram status: Completed 04/10/2021. Repeat every year  Bone Density status: Ordered provider notified. Pt provided with contact info and advised to call to schedule appt.  Lung Cancer Screening:  (Low Dose CT Chest recommended if Age 59-80 years, 30 pack-year currently smoking OR have quit w/in 15years.) does qualify.   Lung Cancer Screening Referral: n/a  Additional Screening:  Hepatitis C Screening: does qualify; Completed 08/10/2019  Vision Screening: Recommended annual ophthalmology exams for early detection of glaucoma and other disorders of the eye. Is the patient up to date with their annual eye exam?  No  Who is the provider or what is the name of the office in which the patient attends annual eye exams? N/a If pt is not established with a provider, would they like to be referred to a provider to establish care? No .   Dental Screening: Recommended annual dental exams for proper oral hygiene  Community Resource Referral / Chronic Care Management: CRR required this visit?  No   CCM required this visit?  No      Plan:     I have personally reviewed and noted the following in the patient's chart:   Medical and social history Use of alcohol, tobacco or illicit drugs  Current medications and supplements including opioid prescriptions. Patient is not currently taking opioid prescriptions. Functional ability and status Nutritional status Physical activity Advanced directives List of other physicians Hospitalizations, surgeries, and ER visits in previous 12 months Vitals Screenings to include cognitive, depression, and falls Referrals and appointments  In addition, I have reviewed and discussed with patient certain preventive protocols, quality metrics, and best practice recommendations. A written personalized care plan for preventive services as well as general preventive health recommendations were provided to patient.     [place] Breeze, [place]   01/18/2022   Nurse Notes: provider notified. Patient would like to know of eye doctor she can visit that will take her insurance/ she was given the idea to give her insurance company a call & the give her specific  places that accept her insurance.

## 2022-01-18 NOTE — Patient Instructions (Signed)

## 2022-02-28 ENCOUNTER — Ambulatory Visit: Payer: Medicare Other | Admitting: Family Medicine

## 2022-04-01 DIAGNOSIS — E039 Hypothyroidism, unspecified: Secondary | ICD-10-CM | POA: Diagnosis not present

## 2022-04-01 DIAGNOSIS — G4733 Obstructive sleep apnea (adult) (pediatric): Secondary | ICD-10-CM | POA: Diagnosis not present

## 2022-04-06 ENCOUNTER — Other Ambulatory Visit: Payer: Self-pay | Admitting: Family Medicine

## 2022-04-06 DIAGNOSIS — K219 Gastro-esophageal reflux disease without esophagitis: Secondary | ICD-10-CM

## 2022-04-08 NOTE — Telephone Encounter (Signed)
Requested by interface surescripts. Last refill 02/26/22. Request refill too soon.  Requested Prescriptions  Refused Prescriptions Disp Refills  . pantoprazole (PROTONIX) 40 MG tablet [Pharmacy Med Name: PANTOPRAZOLE SOD DR 40 MG TAB] 90 tablet 1    Sig: TAKE 1 TABLET BY MOUTH EVERY DAY     Gastroenterology: Proton Pump Inhibitors Passed - 04/06/2022  5:49 PM      Passed - Valid encounter within last 12 months    Recent Outpatient Visits          4 months ago Anxiety and depression   Primary Care at [facility], Clyde Canterbury, MD   6 months ago Bilateral low back pain without sciatica, unspecified chronicity   Primary Care at [facility], MD   10 months ago Anxiety and depression   Primary Care at [facility], MD   1 year ago Anxiety and depression   Primary Care at [facility], MD   1 year ago Elevated serum creatinine   Primary Care at [facility], Bayard Beaver, MD

## 2022-04-12 ENCOUNTER — Other Ambulatory Visit: Payer: Self-pay | Admitting: *Deleted

## 2022-04-12 DIAGNOSIS — E039 Hypothyroidism, unspecified: Secondary | ICD-10-CM

## 2022-04-12 DIAGNOSIS — K219 Gastro-esophageal reflux disease without esophagitis: Secondary | ICD-10-CM

## 2022-04-12 MED ORDER — PANTOPRAZOLE SODIUM 40 MG PO TBEC: 40.0000 mg | DELAYED_RELEASE_TABLET | Freq: Every day | ORAL | 1 refills | Status: DC

## 2022-04-12 MED ORDER — LEVOTHYROXINE SODIUM 100 MCG PO TABS: 100.0000 ug | ORAL_TABLET | Freq: Every day | ORAL | 1 refills | Status: DC

## 2022-04-12 MED ORDER — BACLOFEN 20 MG PO TABS: 20.0000 mg | ORAL_TABLET | Freq: Every day | ORAL | 1 refills | Status: DC

## 2022-04-17 ENCOUNTER — Other Ambulatory Visit: Payer: Self-pay | Admitting: *Deleted

## 2022-04-17 DIAGNOSIS — G8929 Other chronic pain: Secondary | ICD-10-CM

## 2022-04-17 DIAGNOSIS — E785 Hyperlipidemia, unspecified: Secondary | ICD-10-CM

## 2022-04-17 MED ORDER — GABAPENTIN 600 MG PO TABS: 600.0000 mg | ORAL_TABLET | Freq: Three times a day (TID) | ORAL | 0 refills | Status: DC

## 2022-04-17 MED ORDER — SIMVASTATIN 40 MG PO TABS: 40.0000 mg | ORAL_TABLET | Freq: Every day | ORAL | 0 refills | Status: DC

## 2022-04-22 ENCOUNTER — Ambulatory Visit (INDEPENDENT_AMBULATORY_CARE_PROVIDER_SITE_OTHER): Payer: Medicare Other | Admitting: Family Medicine

## 2022-04-22 ENCOUNTER — Encounter: Payer: Self-pay | Admitting: Family Medicine

## 2022-04-22 VITALS — BP 104/64 | HR 96 | Temp 98.1°F | Resp 16 | Wt 125.8 lb

## 2022-04-22 DIAGNOSIS — Z23 Encounter for immunization: Secondary | ICD-10-CM

## 2022-04-22 DIAGNOSIS — E785 Hyperlipidemia, unspecified: Secondary | ICD-10-CM

## 2022-04-22 DIAGNOSIS — Z122 Encounter for screening for malignant neoplasm of respiratory organs: Secondary | ICD-10-CM | POA: Diagnosis not present

## 2022-04-22 DIAGNOSIS — Z1329 Encounter for screening for other suspected endocrine disorder: Secondary | ICD-10-CM

## 2022-04-22 DIAGNOSIS — G8929 Other chronic pain: Secondary | ICD-10-CM

## 2022-04-22 DIAGNOSIS — Z13220 Encounter for screening for lipoid disorders: Secondary | ICD-10-CM

## 2022-04-22 DIAGNOSIS — Z130 Encounter for screening for diseases of the blood and blood-forming organs and certain disorders involving the immune mechanism: Secondary | ICD-10-CM

## 2022-04-22 DIAGNOSIS — Z13228 Encounter for screening for other metabolic disorders: Secondary | ICD-10-CM

## 2022-04-22 DIAGNOSIS — Z0000 Encounter for general adult medical examination without abnormal findings: Secondary | ICD-10-CM | POA: Diagnosis not present

## 2022-04-22 DIAGNOSIS — K219 Gastro-esophageal reflux disease without esophagitis: Secondary | ICD-10-CM

## 2022-04-22 DIAGNOSIS — E039 Hypothyroidism, unspecified: Secondary | ICD-10-CM

## 2022-04-22 MED ORDER — PANTOPRAZOLE SODIUM 40 MG PO TBEC: 40.0000 mg | DELAYED_RELEASE_TABLET | Freq: Every day | ORAL | 1 refills | Status: DC

## 2022-04-22 MED ORDER — GABAPENTIN 600 MG PO TABS: 600.0000 mg | ORAL_TABLET | Freq: Three times a day (TID) | ORAL | 1 refills | Status: DC

## 2022-04-22 MED ORDER — BACLOFEN 20 MG PO TABS: 20.0000 mg | ORAL_TABLET | Freq: Every day | ORAL | 1 refills | Status: DC

## 2022-04-22 MED ORDER — LUBIPROSTONE 24 MCG PO CAPS: 24.0000 ug | ORAL_CAPSULE | Freq: Two times a day (BID) | ORAL | 1 refills | Status: DC

## 2022-04-22 MED ORDER — SIMVASTATIN 40 MG PO TABS: 40.0000 mg | ORAL_TABLET | Freq: Every day | ORAL | 1 refills | Status: AC

## 2022-04-22 MED ORDER — LEVOTHYROXINE SODIUM 100 MCG PO TABS: 100.0000 ug | ORAL_TABLET | Freq: Every day | ORAL | 1 refills | Status: DC

## 2022-04-22 NOTE — Progress Notes (Signed)
Patient was given RSV for vaccine on 04/15/2022

## 2022-04-23 LAB — CBC WITH DIFFERENTIAL/PLATELET
Basophils Absolute: 0.1 10*3/uL (ref 0.0–0.2)
Basos: 1 %
EOS (ABSOLUTE): 0.1 10*3/uL (ref 0.0–0.4)
Eos: 1 %
Hematocrit: 38.7 % (ref 34.0–46.6)
Hemoglobin: 13.1 g/dL (ref 11.1–15.9)
Immature Grans (Abs): 0.1 10*3/uL (ref 0.0–0.1)
Immature Granulocytes: 1 %
Lymphocytes Absolute: 2.4 10*3/uL (ref 0.7–3.1)
Lymphs: 39 %
MCH: 30.9 pg (ref 26.6–33.0)
MCHC: 33.9 g/dL (ref 31.5–35.7)
MCV: 91 fL (ref 79–97)
Monocytes Absolute: 0.7 10*3/uL (ref 0.1–0.9)
Monocytes: 11 %
Neutrophils Absolute: 3 10*3/uL (ref 1.4–7.0)
Neutrophils: 47 %
Platelets: 328 10*3/uL (ref 150–450)
RBC: 4.24 x10E6/uL (ref 3.77–5.28)
RDW: 12.5 % (ref 11.7–15.4)
WBC: 6.2 10*3/uL (ref 3.4–10.8)

## 2022-04-23 LAB — CMP14+EGFR
ALT: 18 IU/L (ref 0–32)
AST: 23 IU/L (ref 0–40)
Albumin/Globulin Ratio: 1.7 (ref 1.2–2.2)
Albumin: 4.7 g/dL (ref 3.9–4.9)
Alkaline Phosphatase: 81 IU/L (ref 44–121)
BUN/Creatinine Ratio: 13 (ref 12–28)
BUN: 15 mg/dL (ref 8–27)
Bilirubin Total: 0.3 mg/dL (ref 0.0–1.2)
CO2: 25 mmol/L (ref 20–29)
Calcium: 9.8 mg/dL (ref 8.7–10.3)
Chloride: 101 mmol/L (ref 96–106)
Creatinine, Ser: 1.17 mg/dL — ABNORMAL HIGH (ref 0.57–1.00)
Globulin, Total: 2.7 g/dL (ref 1.5–4.5)
Glucose: 96 mg/dL (ref 70–99)
Potassium: 4.9 mmol/L (ref 3.5–5.2)
Sodium: 142 mmol/L (ref 134–144)
Total Protein: 7.4 g/dL (ref 6.0–8.5)
eGFR: 52 mL/min/{1.73_m2} — ABNORMAL LOW (ref >59)

## 2022-04-23 LAB — TSH: TSH: 4.49 u[IU]/mL (ref 0.450–4.500)

## 2022-04-23 LAB — VITAMIN D 25 HYDROXY (VIT D DEFICIENCY, FRACTURES): Vit D, 25-Hydroxy: 39 ng/mL (ref 30.0–100.0)

## 2022-04-23 LAB — HEMOGLOBIN A1C
Est. average glucose Bld gHb Est-mCnc: 123 mg/dL
Hgb A1c MFr Bld: 5.9 % — ABNORMAL HIGH (ref 4.8–5.6)

## 2022-04-23 LAB — LIPID PANEL
Chol/HDL Ratio: 2.7 ratio (ref 0.0–4.4)
Cholesterol, Total: 133 mg/dL (ref 100–199)
HDL: 50 mg/dL (ref >39)
LDL Chol Calc (NIH): 63 mg/dL (ref 0–99)
Triglycerides: 107 mg/dL (ref 0–149)
VLDL Cholesterol Cal: 20 mg/dL (ref 5–40)

## 2022-04-23 LAB — T4, FREE: Free T4: 1.49 ng/dL (ref 0.82–1.77)

## 2022-04-25 NOTE — Progress Notes (Signed)
Established Patient Office Visit  Subjective    Patient ID: Tina Bartlett, female    DOB: 03/07/1956  Age: 66 y.o. MRN: [id number]  CC: No chief complaint on file.   HPI Tina Bartlett presents for routine annual exam. Patient denies acute complaints or concerns.    Outpatient Encounter Medications as of 04/22/2022  Medication Sig   baclofen (LIORESAL) 20 MG tablet Take 1 tablet (20 mg total) by mouth daily.   cholecalciferol (VITAMIN D3) 25 MCG (1000 UNIT) tablet Take 1 tablet (1,000 Units total) by mouth daily.   diazepam (VALIUM) 2 MG tablet PLEASE SEE ATTACHED FOR DETAILED DIRECTIONS (Patient not taking: Reported on 11/27/2021)   gabapentin (NEURONTIN) 600 MG tablet Take 1 tablet (600 mg total) by mouth 3 (three) times daily.   levothyroxine (SYNTHROID) 100 MCG tablet Take 1 tablet (100 mcg total) by mouth daily.   lidocaine (XYLOCAINE) 5 % ointment Apply 1 application topically as needed. (Patient not taking: Reported on 11/27/2021)   lubiprostone (AMITIZA) 24 MCG capsule Take 1 capsule (24 mcg total) by mouth 2 (two) times daily with a meal.   Multiple Vitamins-Minerals (MULTIVITAMIN WITH IRON-MINERALS) liquid See admin instructions.   Omega 3 1200 MG CAPS 1 capsule   pantoprazole (PROTONIX) 40 MG tablet Take 1 tablet (40 mg total) by mouth daily.   PREMARIN 0.3 MG tablet Take 0.3 mg by mouth daily.   simvastatin (ZOCOR) 40 MG tablet Take 1 tablet (40 mg total) by mouth daily.   valACYclovir (VALTREX) 500 MG tablet Take by mouth.   [DISCONTINUED] baclofen (LIORESAL) 20 MG tablet Take 1 tablet (20 mg total) by mouth daily.   [DISCONTINUED] gabapentin (NEURONTIN) 600 MG tablet Take 1 tablet (600 mg total) by mouth 3 (three) times daily.   [DISCONTINUED] levothyroxine (SYNTHROID) 100 MCG tablet Take 1 tablet (100 mcg total) by mouth daily.   [DISCONTINUED] lubiprostone (AMITIZA) 24 MCG capsule Take 1 capsule (24 mcg total) by mouth 2 (two) times daily with a meal.   [DISCONTINUED]  pantoprazole (PROTONIX) 40 MG tablet Take 1 tablet (40 mg total) by mouth daily.   [DISCONTINUED] simvastatin (ZOCOR) 40 MG tablet Take 1 tablet (40 mg total) by mouth daily.   No facility-administered encounter medications on file as of 04/22/2022.    Past Medical History:  Diagnosis Date   Thyroid disease     Past Surgical History:  Procedure Laterality Date   APPENDECTOMY     66 years old   CERVICAL FUSION  2020   C5-6 ACDF   JOINT REPLACEMENT Bilateral    Hips   LUMBAR SPINE SURGERY  2020   [place]      Family History  Problem Relation Age of Onset   Cancer Mother     Social History   Socioeconomic History   Marital status: Single    Spouse name: Not on file   Number of children: 0   Years of education: 13+   Highest education level: Not on file  Occupational History   Not on file  Tobacco Use   Smoking status: Light Smoker    Types: Cigarettes   Smokeless tobacco: Never  Vaping Use   Vaping Use: Never used  Substance and Sexual Activity   Alcohol use: Not on file    Comment: socially   Drug use: Not Currently   Sexual activity: Not on file  Other Topics Concern   Not on file  Social History Narrative   Not on  file   Social Determinants of Health   Financial Resource Strain: Medium Risk (01/18/2022)   Overall Financial Resource Strain (CARDIA)    Difficulty of Paying Living Expenses: Somewhat hard  Food Insecurity: Food Insecurity Present (01/18/2022)   Hunger Vital Sign    Worried About Running Out of Food in the Last Year: Often true    Ran Out of Food in the Last Year: Often true  Transportation Needs: No Transportation Needs (01/18/2022)   PRAPARE - Hydrologist (Medical): No    Lack of Transportation (Non-Medical): No  Physical Activity: Insufficiently Active (01/18/2022)   Exercise Vital Sign    Days of Exercise per Week: 7 days    Minutes of Exercise per Session: 20 min   Stress: No Stress Concern Present (01/18/2022)   [place]    Feeling of Stress : Not at all  Social Connections: Moderately Isolated (01/18/2022)   Social Connection and Isolation Panel [NHANES]    Frequency of Communication with Friends and Family: More than three times a week    Frequency of Social Gatherings with Friends and Family: More than three times a week    Attends Religious Services: Never    Marine scientist or Organizations: No    Attends Archivist Meetings: Never    Marital Status: Living with partner  Intimate Partner Violence: Not At Risk (01/18/2022)   Humiliation, Afraid, Rape, and Kick questionnaire    Fear of Current or Ex-Partner: No    Emotionally Abused: No    Physically Abused: No    Sexually Abused: No    Review of Systems  All other systems reviewed and are negative.       Objective    BP 104/64   Pulse 96   Temp 98.1 F (36.7 C) (Oral)   Resp 16   Wt 125 lb 12.8 oz (57.1 kg)   SpO2 96%   BMI 25.41 kg/m   Physical Exam Vitals and nursing note reviewed.  Constitutional:      General: She is not in acute distress. HENT:     Head: Normocephalic and atraumatic.     Right Ear: Tympanic membrane, ear canal and external ear normal.     Left Ear: Tympanic membrane, ear canal and external ear normal.     Nose: Nose normal.     Mouth/Throat:     Mouth: Mucous membranes are moist.     Pharynx: Oropharynx is clear.  Eyes:     Conjunctiva/sclera: Conjunctivae normal.     Pupils: Pupils are equal, round, and reactive to light.  Neck:     Thyroid: No thyromegaly.  Cardiovascular:     Rate and Rhythm: Normal rate and regular rhythm.     Heart sounds: Normal heart sounds. No murmur heard. Pulmonary:     Effort: Pulmonary effort is normal. No respiratory distress.     Breath sounds: Normal breath sounds.  Abdominal:     General: There is no distension.      Palpations: Abdomen is soft. There is no mass.     Tenderness: There is no abdominal tenderness.  Musculoskeletal:        General: Normal range of motion.     Cervical back: Normal range of motion and neck supple.  Skin:    General: Skin is warm and dry.  Neurological:     General: No focal deficit present.     Mental  Status: She is alert and oriented to person, place, and time.  Psychiatric:        Mood and Affect: Mood normal.        Behavior: Behavior normal.         Assessment & Plan:   1. Annual physical exam  - CMP14+EGFR  2. Screening for deficiency anemia  - CBC with Differential  3. Screening for lipid disorders  - Lipid Panel  4. Screening for endocrine/metabolic/immunity disorders  - Hemoglobin A1c - TSH - T4, Free - Vitamin D, 25-hydroxy  5. Screening for lung cancer  - CT CHEST LUNG CA SCREEN LOW DOSE W/O CM; Future  6. Need for prophylactic vaccination against Streptococcus pneumoniae (pneumococcus)  - Pneumococcal polysaccharide vaccine 23-valent greater than or equal to 2yo subcutaneous/IM    No follow-ups on file.   Becky Sax, MD

## 2022-04-26 ENCOUNTER — Ambulatory Visit: Payer: Medicaid Other | Admitting: Family Medicine

## 2022-06-06 ENCOUNTER — Other Ambulatory Visit: Payer: Self-pay | Admitting: Family Medicine

## 2022-06-07 NOTE — Telephone Encounter (Signed)
Unable to refill per protocol, Rx request is too soon, last refill 04/22/22 for 90 and 1 RF. Will refuse.  Requested Prescriptions  Pending Prescriptions Disp Refills   levothyroxine (SYNTHROID) 100 MCG tablet [Pharmacy Med Name: Levothyroxine Sodium 100 MCG Oral Tablet] 100 tablet 2    Sig: TAKE 1 TABLET BY MOUTH DAILY     Endocrinology:  Hypothyroid Agents Passed - 06/06/2022 10:09 PM      Passed - TSH in normal range and within 360 days    TSH  Date Value Ref Range Status  04/22/2022 4.490 0.450 - 4.500 uIU/mL Final         Passed - Valid encounter within last 12 months    Recent Outpatient Visits           1 month ago Annual physical exam   Primary Care at [facility], MD   6 months ago Anxiety and depression   Primary Care at [facility], Lauris Poag, MD   8 months ago Bilateral low back pain without sciatica, unspecified chronicity   Primary Care at [facility], MD   12 months ago Anxiety and depression   Primary Care at [facility], MD   1 year ago Anxiety and depression   Primary Care at [facility], MD

## 2022-06-25 ENCOUNTER — Other Ambulatory Visit: Payer: Self-pay | Admitting: Family Medicine

## 2022-07-21 NOTE — Progress Notes (Unsigned)
Synopsis: Referred for dry cough, left lung pain by [name], MD  Subjective:   PATIENT ID: Tina Bartlett GENDER: female DOB: Oct 17, 1955, MRN: [id number]  No chief complaint on file.  66yF with history of OSA, smoking, hypothyroid, GERD referred for dry cough and left lung pain  Lung cancer screening ct has been ordered by PCP.  Otherwise pertinent review of systems is negative.  Past Medical History:  Diagnosis Date   Thyroid disease      Family History  Problem Relation Age of Onset   Cancer Mother      Past Surgical History:  Procedure Laterality Date   APPENDECTOMY     66 years old   CERVICAL FUSION  2020   C5-6 ACDF   JOINT REPLACEMENT Bilateral    Hips   LUMBAR SPINE SURGERY  2020   [place] History   Socioeconomic History   Marital status: Single    Spouse name: Not on file   Number of children: 0   Years of education: 13+   Highest education level: Not on file  Occupational History   Not on file  Tobacco Use   Smoking status: Light Smoker    Types: Cigarettes   Smokeless tobacco: Never  Vaping Use   Vaping Use: Never used  Substance and Sexual Activity   Alcohol use: Not on file    Comment: socially   Drug use: Not Currently   Sexual activity: Not on file  Other Topics Concern   Not on file  Social History Narrative   Not on file   Social Determinants of Health   Financial Resource Strain: Medium Risk (01/18/2022)   Overall Financial Resource Strain (CARDIA)    Difficulty of Paying Living Expenses: Somewhat hard  Food Insecurity: Food Insecurity Present (01/18/2022)   Hunger Vital Sign    Worried About Running Out of Food in the Last Year: Often true    Ran Out of Food in the Last Year: Often true  Transportation Needs: No Transportation Needs (01/18/2022)   PRAPARE - Hydrologist (Medical): No    Lack of Transportation (Non-Medical): No  Physical  Activity: Insufficiently Active (01/18/2022)   Exercise Vital Sign    Days of Exercise per Week: 7 days    Minutes of Exercise per Session: 20 min  Stress: No Stress Concern Present (01/18/2022)   [place]    Feeling of Stress : Not at all  Social Connections: Moderately Isolated (01/18/2022)   Social Connection and Isolation Panel [NHANES]    Frequency of Communication with Friends and Family: More than three times a week    Frequency of Social Gatherings with Friends and Family: More than three times a week    Attends Religious Services: Never    Marine scientist or Organizations: No    Attends Archivist Meetings: Never    Marital Status: Living with partner  Intimate Partner Violence: Not At Risk (01/18/2022)   Humiliation, Afraid, Rape, and Kick questionnaire    Fear of Current or Ex-Partner: No    Emotionally Abused: No    Physically Abused: No    Sexually Abused: No     Allergies  Allergen Reactions   Hydroxyzine Other (See Comments)    Hallucinations    Nsaids Other (See Comments)    Pt reports causes "tremors and jitters"  Tramadol Nausea Only and Other (See Comments)    Causes drowsiness    Tylenol [Acetaminophen] Hives and Rash     Outpatient Medications Prior to Visit  Medication Sig Dispense Refill   baclofen (LIORESAL) 20 MG tablet Take 1 tablet (20 mg total) by mouth daily. 90 tablet 1   cholecalciferol (VITAMIN D3) 25 MCG (1000 UNIT) tablet Take 1 tablet (1,000 Units total) by mouth daily. 12 tablet 0   diazepam (VALIUM) 2 MG tablet PLEASE SEE ATTACHED FOR DETAILED DIRECTIONS (Patient not taking: Reported on 11/27/2021)     gabapentin (NEURONTIN) 600 MG tablet Take 1 tablet (600 mg total) by mouth 3 (three) times daily. 270 tablet 1   levothyroxine (SYNTHROID) 100 MCG tablet Take 1 tablet (100 mcg total) by mouth daily. 90 tablet 1   lidocaine (XYLOCAINE) 5 % ointment Apply 1  application topically as needed. (Patient not taking: Reported on 11/27/2021) 35.44 g 0   lubiprostone (AMITIZA) 24 MCG capsule Take 1 capsule (24 mcg total) by mouth 2 (two) times daily with a meal. 180 capsule 1   Multiple Vitamins-Minerals (MULTIVITAMIN WITH IRON-MINERALS) liquid See admin instructions.     Omega 3 1200 MG CAPS 1 capsule     pantoprazole (PROTONIX) 40 MG tablet TAKE 1 TABLET BY MOUTH DAILY 100 tablet 2   PREMARIN 0.3 MG tablet Take 0.3 mg by mouth daily.     simvastatin (ZOCOR) 40 MG tablet Take 1 tablet (40 mg total) by mouth daily. 90 tablet 1   valACYclovir (VALTREX) 500 MG tablet Take by mouth.     No facility-administered medications prior to visit.       Objective:   Physical Exam:  General appearance: 66 y.o., female, NAD, conversant  Eyes: anicteric sclerae; PERRL, tracking appropriately HENT: NCAT; MMM Neck: Trachea midline; no lymphadenopathy, no JVD Lungs: CTAB, no crackles, no wheeze, with normal respiratory effort CV: RRR, no murmur  Abdomen: Soft, non-tender; non-distended, BS present  Extremities: No peripheral edema, warm Skin: Normal turgor and texture; no rash Psych: Appropriate affect Neuro: Alert and oriented to person and place, no focal deficit     There were no vitals filed for this visit.   on *** LPM *** RA BMI Readings from Last 3 Encounters:  04/22/22 25.41 kg/m  11/27/21 25.21 kg/m  09/11/21 26.22 kg/m   Wt Readings from Last 3 Encounters:  04/22/22 125 lb 12.8 oz (57.1 kg)  11/27/21 124 lb 12.8 oz (56.6 kg)  09/11/21 129 lb 12.8 oz (58.9 kg)     CBC    Component Value Date/Time   WBC 6.2 04/22/2022 0943   RBC 4.24 04/22/2022 0943   HGB 13.1 04/22/2022 0943   HCT 38.7 04/22/2022 0943   PLT 328 04/22/2022 0943   MCV 91 04/22/2022 0943   MCH 30.9 04/22/2022 0943   MCHC 33.9 04/22/2022 0943   RDW 12.5 04/22/2022 0943   LYMPHSABS 2.4 04/22/2022 0943   EOSABS 0.1 04/22/2022 0943   BASOSABS 0.1 04/22/2022 0943     Eos 100  Chest Imaging: ***  Pulmonary Functions Testing Results:     No data to display          FeNO: ***  Pathology: ***  Echocardiogram: ***  Heart Catheterization: ***    Assessment & Plan:    Plan:      Omar Person, MD [facility] 07/21/2022 5:37 PM

## 2022-07-23 ENCOUNTER — Ambulatory Visit (INDEPENDENT_AMBULATORY_CARE_PROVIDER_SITE_OTHER): Payer: Medicare Other

## 2022-07-23 ENCOUNTER — Ambulatory Visit (INDEPENDENT_AMBULATORY_CARE_PROVIDER_SITE_OTHER): Payer: Medicare Other | Admitting: Student

## 2022-07-23 ENCOUNTER — Encounter: Payer: Self-pay | Admitting: Student

## 2022-07-23 VITALS — BP 124/74 | HR 50 | Temp 98.3°F | Ht 59.0 in | Wt 125.6 lb

## 2022-07-23 DIAGNOSIS — R079 Chest pain, unspecified: Secondary | ICD-10-CM | POA: Diagnosis not present

## 2022-07-23 DIAGNOSIS — R0781 Pleurodynia: Secondary | ICD-10-CM | POA: Diagnosis not present

## 2022-07-23 NOTE — Patient Instructions (Signed)
-   chest x ray today, likely ct scan depending on findings

## 2022-08-07 IMAGING — CT CT CERVICAL SPINE W/O CM
3 series · 8 of 14 positions shown, 9 images · non-contrast
Comparison: None.

CLINICAL DATA: Neck pain radiating into the right arm. Right arm
weakness. History of prior cervical fusion.

EXAM:
CT CERVICAL SPINE WITHOUT CONTRAST
TECHNIQUE: Multidetector CT imaging of the cervical spine was performed without
intravenous contrast. Multiplanar CT image reconstructions were also
generated.

[Series 2: cspine soft · axial · 0.33mm/px · z∈[-166,-104]mm · 2 of 93 slices shown]
[im 31/93  soft-tissue]
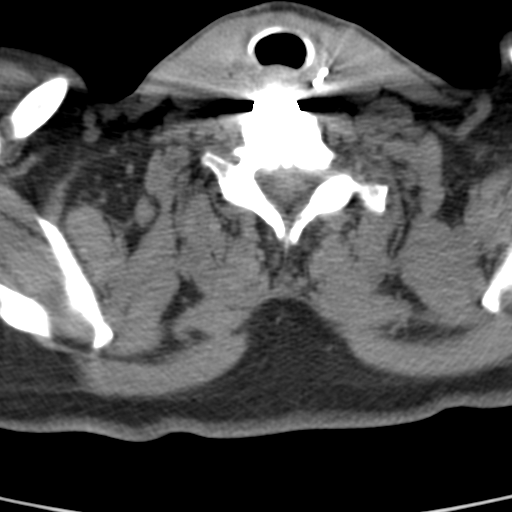
[im 62/93  soft-tissue]
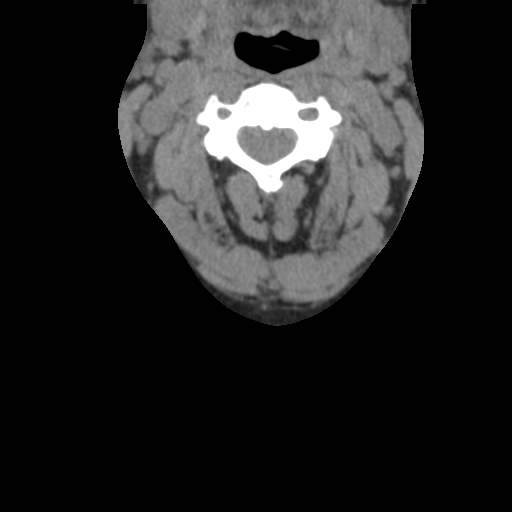

[Series 3: c spine bone · axial · 0.33mm/px · z∈[-180,-88]mm · 3 of 92 slices shown, 4 images]
[im 23/92  soft-tissue]
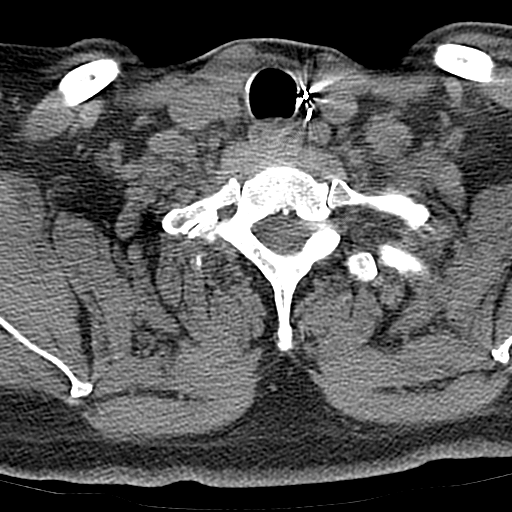
[im 23/92  bone]
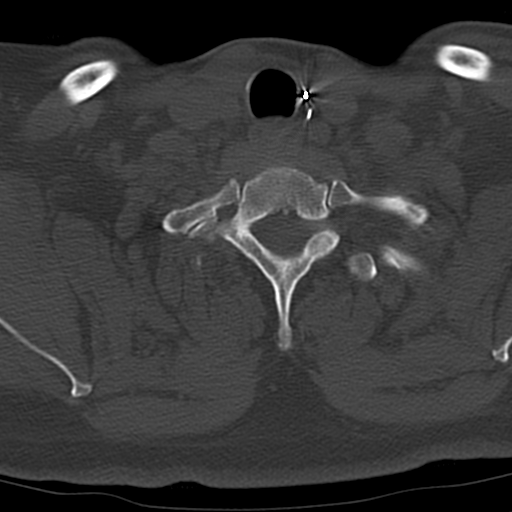
[im 46/92  bone]
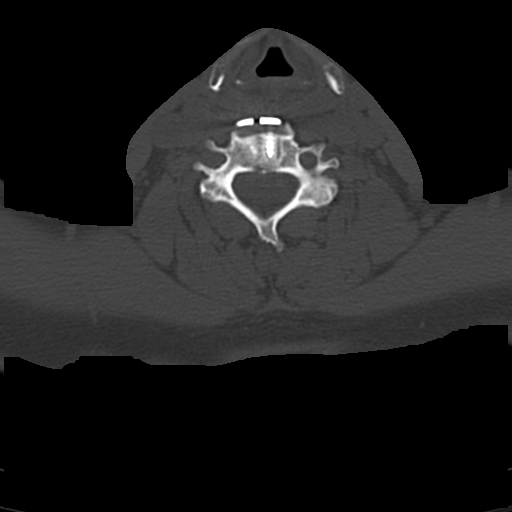
[im 69/92  bone]
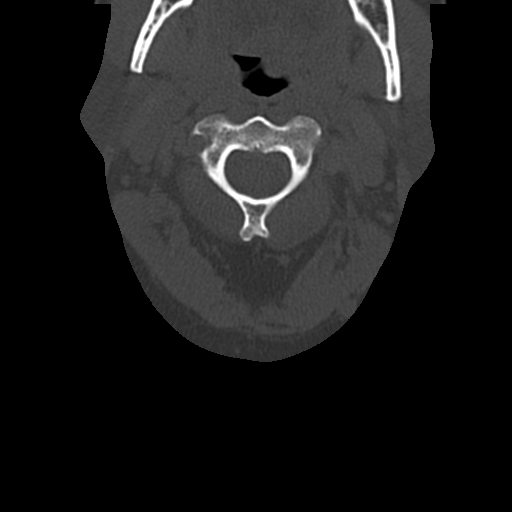

[Series 8: angled axial soft · axial · 0.29mm/px · z∈[-194,-104]mm · 3 of 93 slices shown]
[im 24/93  soft-tissue]
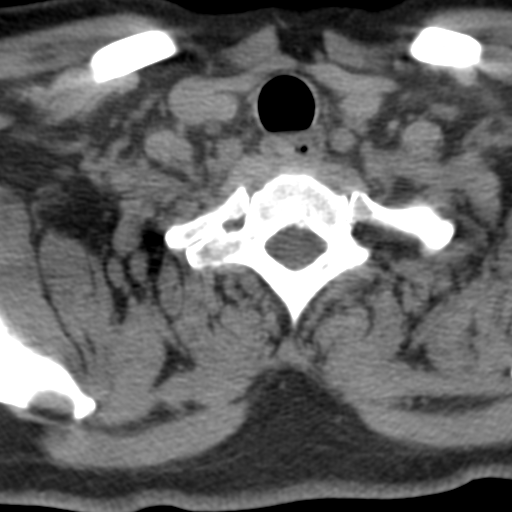
[im 47/93  soft-tissue]
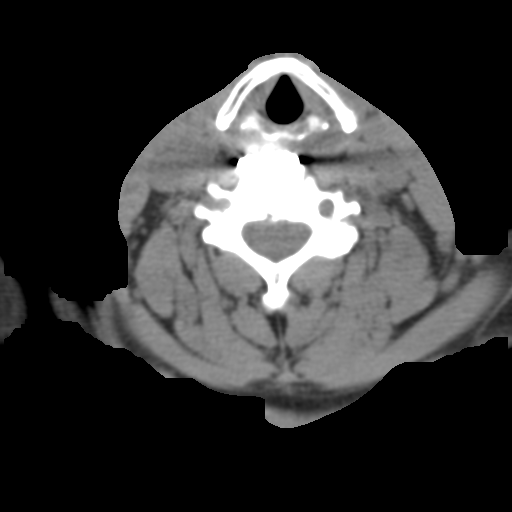
[im 70/93  soft-tissue]
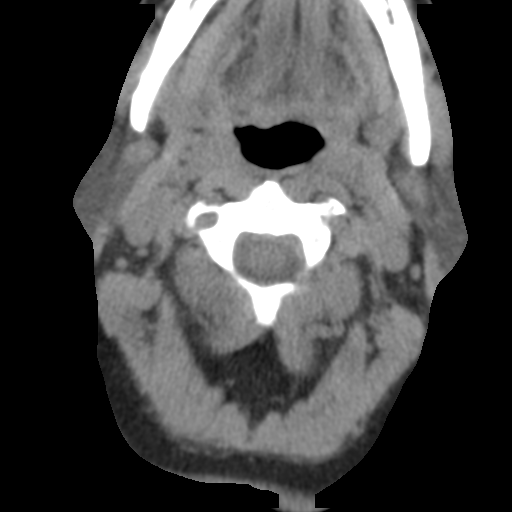

[8 of 14 positions shown; findings below may reference images not displayed]

FINDINGS: Alignment: Normal.

Skull base and vertebrae: No fracture. No primary bone lesion or
focal pathologic process. The patient is status post C5-7 ACDF.
Lucency is seen about the screws in the C6 and C7 vertebral bodies,
worse in C7. No osseous fusion across the C6-7 disc interspace is
identified. There appears to be a small area of fusion across the
C5-6 disc interspace.

Soft tissues and spinal canal: No prevertebral fluid or swelling. No
visible canal hematoma.

Disc levels:  C2-3: Negative.

C3-4: Mild facet degenerative change.  Otherwise negative.

C4-5: Mild facet degenerative change on the right.  No stenosis.

C5-6: Status post discectomy and fusion. Uncovertebral spurring on
the right causes mild foraminal narrowing. The central canal and
left foramen are open.

C6-7: Status post discectomy and fusion. Uncovertebral spurring on
the right causes mild foraminal narrowing. The left foramen and
central canal are open.

C7-T1: Negative.

Upper chest: Clear.

Other: None
IMPRESSION: Status post C5-7 ACDF. The appearance of C6-7 is consistent with
pseudoarthrosis. No bridging bone is seen across the disc interspace
and there is some lucency about the screws in the vertebral bodies,
worse in C7 consistent with loosening.

A small focus of fusion is seen across the C5-6 disc interspace.

Mild appearing right femoral narrowing C5-6 and C6-7 due to
uncovertebral spurring.

## 2022-08-08 ENCOUNTER — Other Ambulatory Visit: Payer: Self-pay | Admitting: Family Medicine

## 2022-08-09 NOTE — Telephone Encounter (Signed)
Refilled 04/22/2022 #[id number] 1 rf. Requested Prescriptions  Pending Prescriptions Disp Refills   levothyroxine (SYNTHROID) 100 MCG tablet [Pharmacy Med Name: Levothyroxine Sodium 100 MCG Oral Tablet] 100 tablet 2    Sig: TAKE 1 TABLET BY MOUTH DAILY     Endocrinology:  Hypothyroid Agents Passed - 08/08/2022 10:21 PM      Passed - TSH in normal range and within 360 days    TSH  Date Value Ref Range Status  04/22/2022 4.490 0.450 - 4.500 uIU/mL Final         Passed - Valid encounter within last 12 months    Recent Outpatient Visits           3 months ago Annual physical exam   [facility] at [facility], MD   8 months ago Anxiety and depression   [facility] Care at [facility], Clyde Canterbury, MD   11 months ago Bilateral low back pain without sciatica, unspecified chronicity   [facility] Care at [facility], MD   1 year ago Anxiety and depression   [facility] at [facility], MD   1 year ago Anxiety and depression   Primary Care at [facility], MD       Future Appointments             In 3 weeks Meier, Hortencia Conradi, MD [facility]

## 2022-09-02 ENCOUNTER — Ambulatory Visit: Payer: Medicaid Other | Admitting: Student

## 2022-09-02 NOTE — Progress Notes (Deleted)
Synopsis: Referred for dry cough, left lung pain by [name], MD  Subjective:   PATIENT ID: Tina Bartlett GENDER: female DOB: 02-24-1956, MRN: [id number]  No chief complaint on file.  66yF with history of OSA, smoking, hypothyroid, GERD referred for dry cough and left lung pain  Lung cancer screening ct has been ordered by PCP.  She says she has occasional L back pain, R back pain over last 6 months which she has wondered if it's either GERD or cancer. She has only tried gabapentin. She worries about her mother's history of lung cancer. No weight loss, no drenching night sweats, fever.   She takes ppi 40 mg daily 30 min before breakfast which takes care of her reflux. She does have sinus congestion, postnasal drainage.   She has had breathing tests maybe 15 ya. Never has had history of asthma.   Grandfather lung cancer, mother lung cancer  She has smoked maybe 10 py total by her report. Active smoking 3-4 cigs per day. No MJ/vaping. 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 Now on disabiltiy after back operations.  Interval HPI Cxr clear   Using NRT gum to facilitate smoking cessation.  Otherwise pertinent review of systems is negative.  Past Medical History:  Diagnosis Date   Thyroid disease      Family History  Problem Relation Age of Onset   Lung cancer Mother      Past Surgical History:  Procedure Laterality Date   APPENDECTOMY     67 years old   CERVICAL FUSION  2020   C5-6 ACDF   JOINT REPLACEMENT Bilateral    Hips   LUMBAR SPINE SURGERY  2020   [place] History   Socioeconomic History   Marital status: Single    Spouse name: Not on file   Number of children: 0   Years of education: 13+   Highest education level: Not on file  Occupational History   Not on file  Tobacco Use   Smoking status: Every Day     Packs/day: 0.50    Years: 38.00    Total pack years: 19.00    Types: Cigarettes   Smokeless tobacco: Never  Vaping Use   Vaping Use: Never used  Substance and Sexual Activity   Alcohol use: Not on file    Comment: socially   Drug use: Not Currently   Sexual activity: Not on file  Other Topics Concern   Not on file  Social History Narrative   Not on file   Social Determinants of Health   Financial Resource Strain: Medium Risk (01/18/2022)   Overall Financial Resource Strain (CARDIA)    Difficulty of Paying Living Expenses: Somewhat hard  Food Insecurity: Food Insecurity Present (01/18/2022)   Hunger Vital Sign    Worried About Running Out of Food in the Last Year: Often true    Ran Out of Food in the Last Year: Often true  Transportation Needs: No Transportation Needs (01/18/2022)   PRAPARE - Hydrologist (Medical): No    Lack of Transportation (Non-Medical): No  Physical Activity: Insufficiently Active (01/18/2022)   Exercise Vital Sign    Days of Exercise per Week: 7 days    Minutes of Exercise per Session: 20 min  Stress: No Stress Concern Present (01/18/2022)   [employer]  Occupational Health - Occupational Stress Questionnaire    Feeling of Stress : Not at all  Social Connections: Moderately Isolated (01/18/2022)   Social Connection and Isolation Panel [NHANES]    Frequency of Communication with Friends and Family: More than three times a week    Frequency of Social Gatherings with Friends and Family: More than three times a week    Attends Religious Services: Never    Marine scientist or Organizations: No    Attends Archivist Meetings: Never    Marital Status: Living with partner  Intimate Partner Violence: Not At Risk (01/18/2022)   Humiliation, Afraid, Rape, and Kick questionnaire    Fear of Current or Ex-Partner: No    Emotionally Abused: No    Physically Abused: No    Sexually Abused: No     Allergies   Allergen Reactions   Hydroxyzine Other (See Comments)    Hallucinations    Nsaids Other (See Comments)    Pt reports causes "tremors and jitters"   Tramadol Nausea Only and Other (See Comments)    Causes drowsiness    Tylenol [Acetaminophen] Hives and Rash     Outpatient Medications Prior to Visit  Medication Sig Dispense Refill   baclofen (LIORESAL) 20 MG tablet Take 1 tablet (20 mg total) by mouth daily. 90 tablet 1   gabapentin (NEURONTIN) 600 MG tablet Take 1 tablet (600 mg total) by mouth 3 (three) times daily. 270 tablet 1   levothyroxine (SYNTHROID) 100 MCG tablet Take 1 tablet (100 mcg total) by mouth daily. 90 tablet 1   lubiprostone (AMITIZA) 24 MCG capsule Take 1 capsule (24 mcg total) by mouth 2 (two) times daily with a meal. 180 capsule 1   Multiple Vitamins-Minerals (MULTIVITAMIN WITH IRON-MINERALS) liquid See admin instructions.     pantoprazole (PROTONIX) 40 MG tablet TAKE 1 TABLET BY MOUTH DAILY 100 tablet 2   simvastatin (ZOCOR) 40 MG tablet Take 1 tablet (40 mg total) by mouth daily. 90 tablet 1   No facility-administered medications prior to visit.       Objective:   Physical Exam:  General appearance: 67 y.o., female, NAD, conversant  Eyes: anicteric sclerae; PERRL, tracking appropriately HENT: NCAT; MMM Neck: Trachea midline; no lymphadenopathy, no JVD Lungs: CTAB, no crackles, no wheeze, with normal respiratory effort CV: RRR, no murmur  Abdomen: Soft, non-tender; non-distended, BS present  Extremities: No peripheral edema, warm Skin: Normal turgor and texture; no rash Psych: Appropriate affect Neuro: Alert and oriented to person and place, no focal deficit     There were no vitals filed for this visit.    on RA BMI Readings from Last 3 Encounters:  07/23/22 25.37 kg/m  04/22/22 25.41 kg/m  11/27/21 25.21 kg/m   Wt Readings from Last 3 Encounters:  07/23/22 125 lb 9.6 oz (57 kg)  04/22/22 125 lb 12.8 oz (57.1 kg)  11/27/21 124 lb 12.8  oz (56.6 kg)     CBC    Component Value Date/Time   WBC 6.2 04/22/2022 0943   RBC 4.24 04/22/2022 0943   HGB 13.1 04/22/2022 0943   HCT 38.7 04/22/2022 0943   PLT 328 04/22/2022 0943   MCV 91 04/22/2022 0943   MCH 30.9 04/22/2022 0943   MCHC 33.9 04/22/2022 0943   RDW 12.5 04/22/2022 0943   LYMPHSABS 2.4 04/22/2022 0943   EOSABS 0.1 04/22/2022 0943   BASOSABS 0.1 04/22/2022 0943    Eos 100  Chest Imaging: CXR 07/23/22 clear  Pulmonary Functions Testing Results:     No data to display             Assessment & Plan:   # Bilateral pleuritic CP Her primary concern is to ensure this isn't related to lung cancer due to family history. Doesn't qualify for lung cancer screening.  # Chronic cough Not that bothersome to her. Could try flonase for PND if recurs. Already on ppi for GERD. Would also check PFT if unresponsive to simply adding flonase given smoking hx.  # Smoking Smoking cessation instruction/counseling given:  counseled patient on the dangers of tobacco use, advised patient to stop smoking, and reviewed strategies to maximize success   Plan: - CXR today - declines pharmacotherapy other than NRT gum, she's close to quitting anyway    Maryjane Hurter, MD [facility] 09/02/2022 5:01 PM

## 2022-09-04 ENCOUNTER — Ambulatory Visit: Payer: 59 | Admitting: Student

## 2022-09-25 ENCOUNTER — Telehealth: Payer: Self-pay | Admitting: Physical Medicine and Rehabilitation

## 2022-09-25 NOTE — Telephone Encounter (Signed)
Patient wants to see [facility] for an injection ASAP

## 2022-09-25 NOTE — Telephone Encounter (Signed)
Spoke with patient and scheduled OV for 09/26/22

## 2022-09-26 ENCOUNTER — Ambulatory Visit (INDEPENDENT_AMBULATORY_CARE_PROVIDER_SITE_OTHER): Payer: 59 | Admitting: Physical Medicine and Rehabilitation

## 2022-09-26 ENCOUNTER — Encounter: Payer: Self-pay | Admitting: Physical Medicine and Rehabilitation

## 2022-09-26 DIAGNOSIS — M7918 Myalgia, other site: Secondary | ICD-10-CM | POA: Diagnosis not present

## 2022-09-26 DIAGNOSIS — G8929 Other chronic pain: Secondary | ICD-10-CM | POA: Diagnosis not present

## 2022-09-26 DIAGNOSIS — M961 Postlaminectomy syndrome, not elsewhere classified: Secondary | ICD-10-CM

## 2022-09-26 DIAGNOSIS — M545 Low back pain, unspecified: Secondary | ICD-10-CM

## 2022-09-26 NOTE — Progress Notes (Signed)
Functional Pain Scale - descriptive words and definitions  Distressing (6)    Pain is present/unable to complete most ADLs limited by pain/sleep is difficult and active distraction is only marginal. Moderate range order  Average Pain  varies    Lower back on both sides, can radiate to the legs, sharp pain in thighs

## 2022-09-26 NOTE — Progress Notes (Unsigned)
Tina Bartlett - 67 y.o. female MRN [id number]  Date of birth: [birth year]  Office Visit Note: Visit Date: 09/26/2022 PCP: [name], MD Referred by: [name], MD  Subjective: Chief Complaint  Patient presents with   Lower Back - Pain   HPI: Tina Bartlett is a 67 y.o. female who comes in today for evaluation of acute on chronic bilateral upper lumbar pain radiating down to bilateral lower back. Last seen in our office in 2022. Pain ongoing since Monday of this week. Pain started when she sat up out of bed. Long standing history of chronic lower back pain. Pain worsens with movement and activity. She describes pain as sore, tight and stabbing sensation, currently rates as 9 out of 10. She has tried heating pad and medications with no relief of pain. History of formal physical therapy while living in [place], [place], no relief of pain with these treatments. Does take Gabapentin and Baclofen as needed. Lumbar x-rays from 2022 exhibits decompression surgery L4-S1 centrally. History of 3 lumbar surgeries while living in [place]. States she has undergone lumbar injections in our office previously, however I am unable to locate procedure notes in EPIC. States she could be confusing lower back injections with previous right shoulder injections.  History of remote ACDF of C5-C6 and C6-C7 in [place]. Previously evaluated by Dr. Alphonzo Severance in our practice. Multiple right glenohumeral shoulder injections and singular cervical epidural steroid injection performed in our office, no relief of pain with these procedures. She was ultimately referred to Dr. Rodell Perna to discuss surgical options, he did recommend ACDF at C3-C4, however patient elected to hold on surgery due to personal issues. She currently denies neck/shoulder issues. Patient denies focal weakness, numbness and tingling. No recent trauma or falls.    Review of Systems  Musculoskeletal:  Positive for back pain and myalgias.   Neurological:  Negative for tingling, sensory change, focal weakness and weakness.  All other systems reviewed and are negative.  Otherwise per HPI.  Assessment & Plan: Visit Diagnoses:    ICD-10-CM   1. Chronic bilateral low back pain without sciatica  M54.50 MR LUMBAR SPINE WO CONTRAST   G89.29 Small Joint Inj    2. Post laminectomy syndrome  M96.1 MR LUMBAR SPINE WO CONTRAST    Small Joint Inj    3. Myofascial pain syndrome  M79.18 MR LUMBAR SPINE WO CONTRAST    Small Joint Inj       Plan: Findings:  Acute on chronic bilateral upper lumbar pain radiating down to bilateral lower back. No radicular symptoms. Patient continues to have severe pain despite good conservative therapies such as formal physical therapy, ice/heat and medications. Patients clinical presentation and exam are complex, differentials could include lumbar strain, facet mediated pain and myofascial pain syndrome. I did explain to her that lumbar strain injuries can take 6-8 weeks to heal. I also feel there could be a type of central sensitization syndrome such as fibromyalgia contributing to her pain. Exam was difficult to perform, I did have to assist her with sitting up due to severe pain. Bilateral lumbar paraspinal region particularly tender to palpation today.  Next step is to obtain lumbar MRI imaging. Depending on imaging we did discuss possible lumbar injections. I do feel she would benefit from short course of physical therapy, would recommend manual treatments and possible dry needling. She is not interested in re-grouping with PT at this time. I did discuss medication management today and performed intramuscular Toradol injection  in the office today without difficulty. Patient does voice severe anxiety with procedures and needles. Would be beneficial to consider pre-procedure sedation if we are planning on lumbar injection. We will have patient follow up for lumbar MRI review and to discuss treatment options. No red  flag symptoms noted upon exam today.     Meds & Orders: No orders of the defined types were placed in this encounter.   Orders Placed This Encounter  Procedures   Small Joint Inj   MR LUMBAR SPINE WO CONTRAST    Follow-up: Return for follow up for lumbar MRI review.   Procedures: Small Joint Inj on 09/26/2022 9:09 AM Indications: pain Details: 25 G needle, posterior (Left deltoid) approach Medications: 30 mg ketorolac 30 MG/ML Outcome: tolerated well, no immediate complications Consent was given by the patient. Immediately prior to procedure a time out was called to verify the correct patient, procedure, equipment, support staff and site/side marked as required. Patient was prepped and draped in the usual sterile fashion.          Clinical History: No specialty comments available.   She reports that she has been smoking cigarettes. She has a 19.00 pack-year smoking history. She has never used smokeless tobacco.  Recent Labs    04/22/22 0943  HGBA1C 5.9*    Objective:  VS:  HT:    WT:   BMI:     BP:   HR: bpm  TEMP: ( )  RESP:  Physical Exam Vitals and nursing note reviewed.  HENT:     Head: Normocephalic and atraumatic.     Right Ear: External ear normal.     Left Ear: External ear normal.     Nose: Nose normal.     Mouth/Throat:     Mouth: Mucous membranes are moist.  Eyes:     Extraocular Movements: Extraocular movements intact.  Cardiovascular:     Rate and Rhythm: Normal rate.     Pulses: Normal pulses.  Pulmonary:     Effort: Pulmonary effort is normal.  Abdominal:     General: Abdomen is flat. There is no distension.  Musculoskeletal:        General: Tenderness present.     Cervical back: Normal range of motion.     Comments: Patient rises from seated position to standing without difficulty. Good lumbar range of motion. No pain noted with facet loading. 5/5 strength noted with bilateral hip flexion, knee flexion/extension, ankle  dorsiflexion/plantarflexion and EHL. No clonus noted bilaterally. No pain upon palpation of greater trochanters. No pain with internal/external rotation of bilateral hips. Sensation intact bilaterally. Tenderness noted to bilateral lumbar paraspinal region. Bilateral lower back tender upon light palpation. Negative slump test bilaterally. Ambulates without aid, gait steady.     Skin:    General: Skin is warm and dry.     Capillary Refill: Capillary refill takes less than 2 seconds.  Neurological:     General: No focal deficit present.     Mental Status: She is alert and oriented to person, place, and time.  Psychiatric:        Mood and Affect: Mood normal.        Behavior: Behavior normal.     Ortho Exam  Imaging: No results found.  Past Medical/Family/Surgical/Social History: Medications & Allergies reviewed per EMR, new medications updated. Patient Active Problem List   Diagnosis Date Noted   Daytime somnolence 11/27/2021   Dream enactment behavior 11/27/2021   Obstructive sleep apnea syndrome 11/27/2021  Acquired hypothyroidism 11/27/2021   History of lumbar laminectomy for spinal cord decompression 06/04/2021   Cervical pseudoarthrosis ([place]) 03/20/2021   Vasomotor symptoms due to menopause 08/16/2019   Hypothyroidism 08/10/2019   Hyperlipidemia 08/10/2019   Past Medical History:  Diagnosis Date   Thyroid disease    Family History  Problem Relation Age of Onset   Lung cancer Mother    Past Surgical History:  Procedure Laterality Date   APPENDECTOMY     67 years old   CERVICAL FUSION  2020   C5-6 ACDF   JOINT REPLACEMENT Bilateral    Hips   LUMBAR SPINE SURGERY  2020   [place] History   Occupational History   Not on file  Tobacco Use   Smoking status: Every Day    Packs/day: 0.50    Years: 38.00    Total pack years: 19.00    Types: Cigarettes   Smokeless tobacco: Never  Vaping Use   Vaping Use: Never  used  Substance and Sexual Activity   Alcohol use: Not on file    Comment: socially   Drug use: Not Currently   Sexual activity: Not on file

## 2022-09-27 NOTE — Progress Notes (Signed)
   09/27/22 1051  [place] in the past year? 0  Number of falls in past year 0  Was there an injury with Fall? 0  Fall Risk Category Calculator 0  Fall Risk  Patient at Risk for Falls Due to No Fall Risks  Fall risk Follow up Falls evaluation completed;Education provided

## 2022-09-30 ENCOUNTER — Ambulatory Visit
Admission: RE | Admit: 2022-09-30 | Discharge: 2022-09-30 | Disposition: A | Discharge status: Home / Self Care | Payer: 59 | Source: Ambulatory Visit | Attending: Physical Medicine and Rehabilitation | Admitting: Physical Medicine and Rehabilitation

## 2022-09-30 DIAGNOSIS — M961 Postlaminectomy syndrome, not elsewhere classified: Secondary | ICD-10-CM

## 2022-09-30 DIAGNOSIS — M545 Low back pain, unspecified: Secondary | ICD-10-CM

## 2022-09-30 DIAGNOSIS — M4316 Spondylolisthesis, lumbar region: Secondary | ICD-10-CM | POA: Diagnosis not present

## 2022-09-30 DIAGNOSIS — M7918 Myalgia, other site: Secondary | ICD-10-CM

## 2022-09-30 MED ORDER — KETOROLAC TROMETHAMINE 30 MG/ML IJ SOLN
30.0000 mg | INTRAMUSCULAR | Status: AC | PRN
  Administered 2022-09-26: 30 mg via INTRAMUSCULAR

## 2022-10-01 ENCOUNTER — Other Ambulatory Visit: Payer: Self-pay | Admitting: Physical Medicine and Rehabilitation

## 2022-10-01 DIAGNOSIS — M5416 Radiculopathy, lumbar region: Secondary | ICD-10-CM

## 2022-10-01 MED ORDER — DIAZEPAM 5 MG PO TABS: ORAL_TABLET | ORAL | 0 refills | Status: DC

## 2022-10-01 NOTE — Progress Notes (Signed)
Spoke with patient this afternoon to discuss recent lumbar MRI imaging. States her pain is now bilateral lower back radiating to buttocks. New moderate sized right paracentral disc protrusion at L5-S1. She would like to proceed with injection. I did call her in pre-procedure Valium.

## 2022-10-03 ENCOUNTER — Telehealth: Payer: Self-pay | Admitting: Orthopaedic Surgery

## 2022-10-03 ENCOUNTER — Telehealth: Payer: Self-pay | Admitting: Physical Medicine and Rehabilitation

## 2022-10-03 NOTE — Telephone Encounter (Signed)
Patient wants to speak to [facility], ASAP. States she is wanting to speak to her today.

## 2022-10-03 NOTE — Telephone Encounter (Signed)
Spoke with patient and scheduled injection for 10/07/22

## 2022-10-07 ENCOUNTER — Ambulatory Visit (INDEPENDENT_AMBULATORY_CARE_PROVIDER_SITE_OTHER): Payer: 59 | Admitting: Family Medicine

## 2022-10-07 ENCOUNTER — Encounter: Payer: Self-pay | Admitting: Family Medicine

## 2022-10-07 ENCOUNTER — Encounter: Payer: 59 | Admitting: Physical Medicine and Rehabilitation

## 2022-10-07 VITALS — BP 118/74 | HR 51 | Temp 98.1°F | Resp 16 | Wt 127.0 lb

## 2022-10-07 DIAGNOSIS — E039 Hypothyroidism, unspecified: Secondary | ICD-10-CM

## 2022-10-07 DIAGNOSIS — E785 Hyperlipidemia, unspecified: Secondary | ICD-10-CM | POA: Diagnosis not present

## 2022-10-07 DIAGNOSIS — M545 Low back pain, unspecified: Secondary | ICD-10-CM

## 2022-10-07 NOTE — Progress Notes (Signed)
Established Patient Office Visit  Subjective    Patient ID: Tina Bartlett, female    DOB: Dec 30, 1955  Age: 67 y.o. MRN: [id number]  CC:  Chief Complaint  Patient presents with   Follow-up    HPI Tina Bartlett presents for routine follow up of chronic med issues. Patient also reports that she will soon be  having a steroid injection for her chronic back pain. Patient denies acute complaints.    Outpatient Encounter Medications as of 10/07/2022  Medication Sig   baclofen (LIORESAL) 20 MG tablet Take 1 tablet (20 mg total) by mouth daily.   diazepam (VALIUM) 5 MG tablet Take one tablet by mouth with food one hour prior to procedure. May repeat 30 minutes prior if needed.   gabapentin (NEURONTIN) 600 MG tablet Take 1 tablet (600 mg total) by mouth 3 (three) times daily.   levothyroxine (SYNTHROID) 100 MCG tablet Take 1 tablet (100 mcg total) by mouth daily.   lubiprostone (AMITIZA) 24 MCG capsule Take 1 capsule (24 mcg total) by mouth 2 (two) times daily with a meal.   Multiple Vitamins-Minerals (MULTIVITAMIN WITH IRON-MINERALS) liquid See admin instructions.   simvastatin (ZOCOR) 40 MG tablet Take 1 tablet (40 mg total) by mouth daily.   [DISCONTINUED] pantoprazole (PROTONIX) 40 MG tablet TAKE 1 TABLET BY MOUTH DAILY   No facility-administered encounter medications on file as of 10/07/2022.    Past Medical History:  Diagnosis Date   Thyroid disease     Past Surgical History:  Procedure Laterality Date   APPENDECTOMY     67 years old   CERVICAL FUSION  2020   C5-6 ACDF   JOINT REPLACEMENT Bilateral    Hips   LUMBAR SPINE SURGERY  2020   [facility] [place]   THYROIDECTOMY      Family History  Problem Relation Age of Onset   Lung cancer Mother     Social History   Socioeconomic History   Marital status: Single    Spouse name: Not on file   Number of children: 0   Years of education: 13+   Highest education level: Not on file  Occupational History    Not on file  Tobacco Use   Smoking status: Every Day    Packs/day: 0.50    Years: 38.00    Additional pack years: 0.00    Total pack years: 19.00    Types: Cigarettes   Smokeless tobacco: Never  Vaping Use   Vaping Use: Never used  Substance and Sexual Activity   Alcohol use: Not on file    Comment: socially   Drug use: Not Currently   Sexual activity: Not on file  Other Topics Concern   Not on file  Social History Narrative   Not on file   Social Determinants of Health   Financial Resource Strain: Medium Risk (01/18/2022)   Overall Financial Resource Strain (CARDIA)    Difficulty of Paying Living Expenses: Somewhat hard  Food Insecurity: Food Insecurity Present (01/18/2022)   Hunger Vital Sign    Worried About Running Out of Food in the Last Year: Often true    Ran Out of Food in the Last Year: Often true  Transportation Needs: No Transportation Needs (01/18/2022)   PRAPARE - Administrator, Civil Service (Medical): No    Lack of Transportation (Non-Medical): No  Physical Activity: Insufficiently Active (01/18/2022)   Exercise Vital Sign    Days of Exercise per Week: 7 days  Minutes of Exercise per Session: 20 min  Stress: No Stress Concern Present (01/18/2022)   [employer] of Occupational Health - Occupational Stress Questionnaire    Feeling of Stress : Not at all  Social Connections: Moderately Isolated (01/18/2022)   Social Connection and Isolation Panel [NHANES]    Frequency of Communication with Friends and Family: More than three times a week    Frequency of Social Gatherings with Friends and Family: More than three times a week    Attends Religious Services: Never    Database administrator or Organizations: No    Attends Banker Meetings: Never    Marital Status: Living with partner  Intimate Partner Violence: Not At Risk (01/18/2022)   Humiliation, Afraid, Rape, and Kick questionnaire    Fear of Current or Ex-Partner: No     Emotionally Abused: No    Physically Abused: No    Sexually Abused: No    Review of Systems  Musculoskeletal:  Positive for back pain.  All other systems reviewed and are negative.       Objective    BP 118/74   Pulse (!) 51   Temp 98.1 F (36.7 C) (Oral)   Resp 16   Wt 127 lb (57.6 kg)   SpO2 98%   BMI 25.65 kg/m   Physical Exam Vitals and nursing note reviewed.  Constitutional:      General: She is not in acute distress. Neck:     Thyroid: No thyromegaly.  Cardiovascular:     Rate and Rhythm: Normal rate and regular rhythm.  Pulmonary:     Effort: Pulmonary effort is normal.     Breath sounds: Normal breath sounds.  Abdominal:     Palpations: Abdomen is soft.     Tenderness: There is no abdominal tenderness.  Musculoskeletal:     Cervical back: Normal range of motion and neck supple.     Lumbar back: Spasms and tenderness present. No deformity. Decreased range of motion.  Neurological:     General: No focal deficit present.     Mental Status: She is alert and oriented to person, place, and time.         Assessment & Plan:   1. Hypothyroidism, unspecified type Appears stable. Continue   2. Hyperlipidemia, unspecified hyperlipidemia type Continue   3. Bilateral low back pain without sciatica, unspecified chronicity Managementt as per consultnatn    Return in about 6 months (around 04/09/2023) for follow up.   [name], MD

## 2022-10-07 NOTE — Progress Notes (Signed)
Patient is here for their 6 month follow-up Patient has no concerns today Care gaps have been discussed with patient  

## 2022-10-09 ENCOUNTER — Encounter: Payer: Self-pay | Admitting: Family Medicine

## 2022-10-11 ENCOUNTER — Ambulatory Visit (INDEPENDENT_AMBULATORY_CARE_PROVIDER_SITE_OTHER): Payer: 59 | Admitting: Physical Medicine and Rehabilitation

## 2022-10-11 ENCOUNTER — Other Ambulatory Visit: Payer: Self-pay

## 2022-10-11 VITALS — BP 109/73 | HR 62

## 2022-10-11 DIAGNOSIS — M5416 Radiculopathy, lumbar region: Secondary | ICD-10-CM

## 2022-10-11 MED ORDER — METHYLPREDNISOLONE ACETATE 80 MG/ML IJ SUSP
80.0000 mg | Freq: Once | INTRAMUSCULAR | Status: AC
  Administered 2022-10-11: 80 mg

## 2022-10-11 NOTE — Progress Notes (Unsigned)
Functional Pain Scale - descriptive words and definitions  Distressing (6)    Pain is present/unable to complete most ADLs limited by pain/sleep is difficult and active distraction is only marginal. Moderate range order  Average Pain 9   +Driver, -BT, -Dye Allergies.  Lower back pain on both sides

## 2022-10-11 NOTE — Patient Instructions (Signed)

## 2022-10-13 ENCOUNTER — Other Ambulatory Visit: Payer: Self-pay | Admitting: Family Medicine

## 2022-10-14 ENCOUNTER — Telehealth: Payer: Self-pay | Admitting: Physical Medicine and Rehabilitation

## 2022-10-14 NOTE — Telephone Encounter (Signed)
Pt called requesting a call back. Pt states she had back injections on Friday and her pains are worse. Pt is asking for a call back from [place] or [place]. Pt phone number is [phone].

## 2022-10-14 NOTE — Telephone Encounter (Signed)
Patient asking to have someone from Dr. Kennon Portela office call her

## 2022-10-14 NOTE — Telephone Encounter (Signed)
Spoke with patient and she wants something for her pain. She is requesting Lidocaine patches and something that will not "make her slow". Informed her that insurance may not pay for lidocaine patches due to it being over the counter, but informed her that I would ask

## 2022-10-15 ENCOUNTER — Telehealth: Payer: Self-pay | Admitting: Physical Medicine and Rehabilitation

## 2022-10-15 ENCOUNTER — Other Ambulatory Visit: Payer: Self-pay | Admitting: Physical Medicine and Rehabilitation

## 2022-10-15 NOTE — Telephone Encounter (Signed)
Patient states she cannot take any pain medications due to intolerance or allergy. Informed her since she is already on a muscle relaxer, to call Dr. Redmond Pulling to see if it can be increased. Patient scheduled OV for 10/21/22

## 2022-10-15 NOTE — Telephone Encounter (Signed)
See previous encounter

## 2022-10-15 NOTE — Telephone Encounter (Signed)
Please call patient she needs patches for pain.Marland Kitchen

## 2022-10-16 ENCOUNTER — Telehealth: Payer: Self-pay | Admitting: Physical Medicine and Rehabilitation

## 2022-10-16 NOTE — Procedures (Signed)
S1 Lumbosacral Transforaminal Epidural Steroid Injection - Sub-Pedicular Approach with Fluoroscopic Guidance   Patient: Tina Bartlett      Date of Birth: 02/04/1956 MRN: [id number] PCP: [name], MD      Visit Date: 10/11/2022   Universal Protocol:    Date/Time: 10/16/2410:28 PM  Consent Given By: the patient  Position:  PRONE  Additional Comments: Vital signs were monitored before and after the procedure. Patient was prepped and draped in the usual sterile fashion. The correct patient, procedure, and site was verified.   Injection Procedure Details:  Procedure Site One Meds Administered:  Meds ordered this encounter  Medications   methylPREDNISolone acetate (DEPO-MEDROL) injection 80 mg    Laterality: Bilateral  Location/Site:  S1 Foramen   Needle size: 22 ga.  Needle type: Spinal  Needle Placement: Transforaminal  Findings:   -Comments: Excellent flow of contrast along the nerve, nerve root and into the epidural space.  Epidurogram: Contrast epidurogram showed no nerve root cut off or restricted flow pattern.  Procedure Details: After squaring off the sacral end-plate to get a true AP view, the C-arm was positioned so that the best possible view of the S1 foramen was visualized. The soft tissues overlying this structure were infiltrated with 2-3 ml. of 1% Lidocaine without Epinephrine.    The spinal needle was inserted toward the target using a "trajectory" view along the fluoroscope beam.  Under AP and lateral visualization, the needle was advanced so it did not puncture dura. Biplanar projections were used to confirm position. Aspiration was confirmed to be negative for CSF and/or blood. A 1-2 ml. volume of Isovue-250 was injected and flow of contrast was noted at each level. Radiographs were obtained for documentation purposes.   After attaining the desired flow of contrast documented above, a 0.5 to 1.0 ml test dose of 0.25% Marcaine was injected into each  respective transforaminal space.  The patient was observed for 90 seconds post injection.  After no sensory deficits were reported, and normal lower extremity motor function was noted,   the above injectate was administered so that equal amounts of the injectate were placed at each foramen (level) into the transforaminal epidural space.   Additional Comments:  The patient tolerated the procedure well Dressing: Band-Aid with 2 x 2 sterile gauze    Post-procedure details: Patient was observed during the procedure. Post-procedure instructions were reviewed.  Patient left the clinic in stable condition.

## 2022-10-16 NOTE — Telephone Encounter (Signed)
Pt requesting a call back from [place] did not leave a reason for call back only to say she has been crying all night. Pt phone number is [phone].

## 2022-10-16 NOTE — Progress Notes (Signed)
Tina Bartlett - 67 y.o. female MRN [id number]  Date of birth: 1956-04-14  Office Visit Note: Visit Date: 10/11/2022 PCP: [name], MD Referred by: [name], MD  Subjective: Chief Complaint  Patient presents with   Lower Back - Pain   HPI:  Tina Bartlett is a 67 y.o. female who comes in today at the request of [name], FNP and Dr. Rodell Perna for planned Bilateral S1-2 Lumbar Transforaminal epidural steroid injection with fluoroscopic guidance.  The patient has failed conservative care including home exercise, medications, time and activity modification.  This injection will be diagnostic and hopefully therapeutic.  Please see requesting physician notes for further details and justification.   ROS Otherwise per HPI.  Assessment & Plan: Visit Diagnoses:    ICD-10-CM   1. Lumbar radiculopathy  M54.16 XR C-ARM NO REPORT    Epidural Steroid injection    methylPREDNISolone acetate (DEPO-MEDROL) injection 80 mg      Plan: No additional findings.   Meds & Orders:  Meds ordered this encounter  Medications   methylPREDNISolone acetate (DEPO-MEDROL) injection 80 mg    Orders Placed This Encounter  Procedures   XR C-ARM NO REPORT   Epidural Steroid injection    Follow-up: Return for visit to requesting provider as needed.   Procedures: No procedures performed  S1 Lumbosacral Transforaminal Epidural Steroid Injection - Sub-Pedicular Approach with Fluoroscopic Guidance   Patient: Tina Bartlett      Date of Birth: 10/21/1955 MRN: [id number] PCP: [name], MD      Visit Date: 10/11/2022   Universal Protocol:    Date/Time: 10/16/2410:28 PM  Consent Given By: the patient  Position:  PRONE  Additional Comments: Vital signs were monitored before and after the procedure. Patient was prepped and draped in the usual sterile fashion. The correct patient, procedure, and site was verified.   Injection Procedure Details:  Procedure Site One Meds  Administered:  Meds ordered this encounter  Medications   methylPREDNISolone acetate (DEPO-MEDROL) injection 80 mg    Laterality: Bilateral  Location/Site:  S1 Foramen   Needle size: 22 ga.  Needle type: Spinal  Needle Placement: Transforaminal  Findings:   -Comments: Excellent flow of contrast along the nerve, nerve root and into the epidural space.  Epidurogram: Contrast epidurogram showed no nerve root cut off or restricted flow pattern.  Procedure Details: After squaring off the sacral end-plate to get a true AP view, the C-arm was positioned so that the best possible view of the S1 foramen was visualized. The soft tissues overlying this structure were infiltrated with 2-3 ml. of 1% Lidocaine without Epinephrine.    The spinal needle was inserted toward the target using a "trajectory" view along the fluoroscope beam.  Under AP and lateral visualization, the needle was advanced so it did not puncture dura. Biplanar projections were used to confirm position. Aspiration was confirmed to be negative for CSF and/or blood. A 1-2 ml. volume of Isovue-250 was injected and flow of contrast was noted at each level. Radiographs were obtained for documentation purposes.   After attaining the desired flow of contrast documented above, a 0.5 to 1.0 ml test dose of 0.25% Marcaine was injected into each respective transforaminal space.  The patient was observed for 90 seconds post injection.  After no sensory deficits were reported, and normal lower extremity motor function was noted,   the above injectate was administered so that equal amounts of the injectate were placed at each foramen (level) into the transforaminal epidural  space.   Additional Comments:  The patient tolerated the procedure well Dressing: Band-Aid with 2 x 2 sterile gauze    Post-procedure details: Patient was observed during the procedure. Post-procedure instructions were reviewed.  Patient left the clinic in stable  condition.   Clinical History: EXAM: MRI LUMBAR SPINE WITHOUT CONTRAST   TECHNIQUE: Multiplanar, multisequence MR imaging of the lumbar spine was performed. No intravenous contrast was administered.   COMPARISON:  Lumbar spine radiographs 05/29/2021   FINDINGS: Segmentation: Conventional anatomy assumed, with the last open disc space designated L5-S1.Concordant with prior radiographs.   Alignment:  Minimal degenerative anterolisthesis at L4-5.   Vertebrae: No worrisome osseous lesion, acute fracture or pars defect. Wide bilateral laminectomies at L4 and L5. Prominent T2 hyperintensity within the superior endplate of QA348G, incompletely visualized by this examination.   Conus medullaris: Extends to the L2 level and appears normal.   Paraspinal and other soft tissues: No significant paraspinal findings.   Disc levels:   Sagittal images demonstrate moderate disc bulging at the T9-10, T10-11 and T11-12 levels. No significant resulting cord deformity. As above, there is T2 hyperintensity within the superior endplate of QA348G which is incompletely visualized.   No significant disc space findings at T12-L1 or L1-2.   L2-3: Mild disc bulging with a broad-based foraminal and extraforaminal disc protrusion on the left. Mild facet and ligamentous hypertrophy. There is left foraminal narrowing with possible encroachment on the left L2 nerve root beyond the foramen. The spinal canal and right foramen are patent.   L3-4: Mild disc bulging, facet and ligamentous hypertrophy. No significant spinal stenosis or nerve root encroachment.   L4-5: Mild disc bulging eccentric to the left and mild bilateral facet hypertrophy. The spinal canal is decompressed by the previous laminectomies. Minimal foraminal narrowing bilaterally without definite nerve root encroachment   L5-S1: Mild disc bulging with a small to moderate right paracentral disc extrusion demonstrating caudal migration behind the  1st sacral segment. There is mild mass effect on the thecal sac and possible right S1 nerve root encroachment. The spinal canal is decompressed by the previous laminectomies. Mild bilateral facet hypertrophy contributing to mild foraminal narrowing bilaterally.   IMPRESSION: 1. Previous laminectomies at L4 and L5. 2. Broad-based foraminal and extraforaminal disc protrusion on the left at L2-3 may encroach on the left L2 nerve root beyond the foramen. 3. Small to moderate size right paracentral disc extrusion at L5-S1 with caudal migration, mild mass effect on the thecal sac and possible right S1 nerve root encroachment. Mild foraminal narrowing bilaterally due to facet hypertrophy. 4. Moderate disc bulging at T9-10, T10-11 and T11-12 without resulting cord deformity. T2 hyperintensity within the superior endplate of QA348G, possibly discogenic but incompletely visualized by this examination. Consider further evaluation with thoracic MRI.     Electronically Signed   By: Richardean Sale M.D.   On: 10/01/2022 13:18     Objective:  VS:  HT:    WT:   BMI:     BP:109/73  HR:62bpm  TEMP: ( )  RESP:  Physical Exam Vitals and nursing note reviewed.  Constitutional:      General: She is not in acute distress.    Appearance: Normal appearance. She is not ill-appearing.  HENT:     Head: Normocephalic and atraumatic.     Right Ear: External ear normal.     Left Ear: External ear normal.  Eyes:     Extraocular Movements: Extraocular movements intact.  Cardiovascular:     Rate  and Rhythm: Normal rate.     Pulses: Normal pulses.  Pulmonary:     Effort: Pulmonary effort is normal. No respiratory distress.  Abdominal:     General: There is no distension.     Palpations: Abdomen is soft.  Musculoskeletal:        General: Tenderness present.     Cervical back: Neck supple.     Right lower leg: No edema.     Left lower leg: No edema.     Comments: Patient has good distal strength  with no pain over the greater trochanters.  No clonus or focal weakness.  Skin:    Findings: No erythema, lesion or rash.  Neurological:     General: No focal deficit present.     Mental Status: She is alert and oriented to person, place, and time.     Sensory: No sensory deficit.     Motor: No weakness or abnormal muscle tone.     Coordination: Coordination normal.  Psychiatric:        Mood and Affect: Mood normal.        Behavior: Behavior normal.      Imaging: No results found.

## 2022-10-17 ENCOUNTER — Telehealth: Payer: Self-pay | Admitting: Physical Medicine and Rehabilitation

## 2022-10-17 ENCOUNTER — Other Ambulatory Visit: Payer: Self-pay | Admitting: Physical Medicine and Rehabilitation

## 2022-10-17 NOTE — Telephone Encounter (Signed)
Pt was transferred to [facility] phone and I spoke with her. She stated she has been calling all week and in a lot of pain. She stated she took 3 baclofen which caused her to have hallucinations. She also stated the only thing that helps her is valium and xanax. Per Jinny Blossom pt was advised that this wont be given to her. Pt thinks she is going to have another injection on Monday. I advised she is set up for a follow-up office visit. She was upset by this and wants to talk to Dr. Ernestina Patches. I advised her I would send a message.

## 2022-10-17 NOTE — Telephone Encounter (Signed)
Patient has called into office multiple times over the last several days requesting medication to help with her pain. She does have allergies/intolerances to Tramadol, Tylenol and Ibuprofen. Patient states she called her PCP and was instructed to take Baclofen 3 times a day, she is now experiencing hallucinations. She is requesting Xanax and/or Valium from [place] today. I feel it best for her to be seen in the office before any prescribing is done. She recently underwent bilateral S1 transforaminal epidural steroid injection on 10/11/2022 with no relief of pain. She is scheduled to be seen in our office on Monday 10/21/2022. Options for her treatment could include referral for chronic pain management. I did instruct her to seek care in the emergency department if her pain worsens.

## 2022-10-21 ENCOUNTER — Ambulatory Visit (INDEPENDENT_AMBULATORY_CARE_PROVIDER_SITE_OTHER): Payer: Medicare HMO | Admitting: Physical Medicine and Rehabilitation

## 2022-10-21 ENCOUNTER — Encounter: Payer: Self-pay | Admitting: Physical Medicine and Rehabilitation

## 2022-10-21 DIAGNOSIS — M5116 Intervertebral disc disorders with radiculopathy, lumbar region: Secondary | ICD-10-CM

## 2022-10-21 DIAGNOSIS — M5416 Radiculopathy, lumbar region: Secondary | ICD-10-CM

## 2022-10-21 DIAGNOSIS — M961 Postlaminectomy syndrome, not elsewhere classified: Secondary | ICD-10-CM | POA: Diagnosis not present

## 2022-10-21 MED ORDER — DULOXETINE HCL 30 MG PO CPEP: ORAL_CAPSULE | ORAL | 3 refills | Status: DC

## 2022-10-21 MED ORDER — DIAZEPAM 5 MG PO TABS: ORAL_TABLET | ORAL | 0 refills | Status: DC

## 2022-10-21 NOTE — Progress Notes (Unsigned)
Tina Bartlett - 67 y.o. female MRN [id number]  Date of birth: [birth year]  Office Visit Note: Visit Date: 10/21/2022 PCP: [name], MD Referred by: [name], MD  Subjective: Chief Complaint  Patient presents with   Lower Back - Pain   HPI: Tina Bartlett is a 67 y.o. female who comes in today for evaluation of chronic, worsening and severe acute on chronic bilateral upper lumbar pain radiating down to bilateral lower back and right lateral leg to foot. Patient is tearful during our visit today. Pain ongoing for several weeks. Long standing history of chronic lower back pain. Pain worsens with movement and activity, she describes pain as sore, aching and stabbing sensation, currently rates as 8 out of 10. No relief of pain with heating pad and medications. She does not feel Gabapentin is helping to alleviate her pain. Recent lumbar MRI imaging exhibits prior decompression surgery L4-S1 centrally. Small to moderate size right paracentral disc extrusion at L5-S1 causing caudal migration and mild mass effect on the thecal sac, possible right S1 nerve root encroachment. No high grade spinal canal stenosis noted. History of 3 lumbar surgeries while living in [place].   Recently underwent bilateral S1 transforaminal epidural steroid injection in our office on 10/11/2022, no relief of pain with this procedure. Patient states pain increased post injection. States she attempted to get in touch with her primary care provider Dr. [name] whom does prescribe Baclofen for her, she was unable to get in contact with PCP office and elected to take 3 tablets of Baclofen at home in attempt to help relieve her pain. States Baclofen caused dizziness and hallucinations. She has not taken medication since adverse issues. She was previously evaluated by Dr. Alphonzo Severance in our practice. Multiple right glenohumeral shoulder injections and singular cervical epidural steroid injection performed in our office,  no relief of pain with these procedures. She was ultimately referred to Dr. Rodell Perna to discuss surgical options, he did recommend ACDF at C3-C4, however patient elected to hold on surgery due to personal issues.  Patient denies focal weakness, numbness and tingling. No recent trauma or falls.       Review of Systems  Musculoskeletal:  Positive for back pain.  Neurological:  Negative for tingling, sensory change, focal weakness and weakness.  All other systems reviewed and are negative.  Otherwise per HPI.  Assessment & Plan: Visit Diagnoses:    ICD-10-CM   1. Lumbar radiculopathy  M54.16 Ambulatory referral to Physical Medicine Rehab    2. Post laminectomy syndrome  M96.1 Ambulatory referral to Physical Medicine Rehab    3. Intervertebral disc disorders with radiculopathy, lumbar region  M51.16 Ambulatory referral to Physical Medicine Rehab       Plan: Findings:  Chronic bilateral upper lumbar pain radiating down to bilateral lower back and right lateral leg to foot. Patient continues to have severe pain despite good conservative therapies such as medications and rest. Patients clinical presentation and exam are consistent with L5 nerve pattern. Next step is to perform diagnostic and hopefully therapeutic right L5 transforaminal epidural steroid injection under fluoroscopic guidance. There is right sided paracentral disc extrusion at L5-S1.  If good relief of pain we can repeat this injection infrequently as needed. I did prescribe pre-procedure Valium for her to take on day of procedure as she does voice severe anxiety related to injection.  I also discussed medication management with her today in detail and emphasized the importance of taking medications as prescribed. I did  place prescription for Cymbalta, will start at 30 mg once daily for several weeks, if tolerating well plan is to increase to twice daily. I did inform her that Cymbalta can cause gastrointestinal side effects  initially, however should not last for more than several days. If her pain persists post injection and medication management we do feel patient would benefit from surgical consultation with our spine surgeon Dr. Ileene Rubens. Patient voices understanding, she has no questions at this time. No red flag symptoms noted upon exam today.     Meds & Orders:  Meds ordered this encounter  Medications   DULoxetine (CYMBALTA) 30 MG capsule    Sig: Take 1 capsule (30 mg total) once a day by mouth for 2 weeks, then take 1 capsule (30 mg) twice a day.    Dispense:  60 capsule    Refill:  3   diazepam (VALIUM) 5 MG tablet    Sig: Take one tablet by mouth with food one hour prior to procedure. May repeat 30 minutes prior if needed.    Dispense:  2 tablet    Refill:  0    Orders Placed This Encounter  Procedures   Ambulatory referral to Physical Medicine Rehab    Follow-up: Return for Right L5 transforaminal epidural steroid injection.   Procedures: No procedures performed      Clinical History: EXAM: MRI LUMBAR SPINE WITHOUT CONTRAST   TECHNIQUE: Multiplanar, multisequence MR imaging of the lumbar spine was performed. No intravenous contrast was administered.   COMPARISON:  Lumbar spine radiographs 05/29/2021   FINDINGS: Segmentation: Conventional anatomy assumed, with the last open disc space designated L5-S1.Concordant with prior radiographs.   Alignment:  Minimal degenerative anterolisthesis at L4-5.   Vertebrae: No worrisome osseous lesion, acute fracture or pars defect. Wide bilateral laminectomies at L4 and L5. Prominent T2 hyperintensity within the superior endplate of QA348G, incompletely visualized by this examination.   Conus medullaris: Extends to the L2 level and appears normal.   Paraspinal and other soft tissues: No significant paraspinal findings.   Disc levels:   Sagittal images demonstrate moderate disc bulging at the T9-10, T10-11 and T11-12 levels. No  significant resulting cord deformity. As above, there is T2 hyperintensity within the superior endplate of QA348G which is incompletely visualized.   No significant disc space findings at T12-L1 or L1-2.   L2-3: Mild disc bulging with a broad-based foraminal and extraforaminal disc protrusion on the left. Mild facet and ligamentous hypertrophy. There is left foraminal narrowing with possible encroachment on the left L2 nerve root beyond the foramen. The spinal canal and right foramen are patent.   L3-4: Mild disc bulging, facet and ligamentous hypertrophy. No significant spinal stenosis or nerve root encroachment.   L4-5: Mild disc bulging eccentric to the left and mild bilateral facet hypertrophy. The spinal canal is decompressed by the previous laminectomies. Minimal foraminal narrowing bilaterally without definite nerve root encroachment   L5-S1: Mild disc bulging with a small to moderate right paracentral disc extrusion demonstrating caudal migration behind the 1st sacral segment. There is mild mass effect on the thecal sac and possible right S1 nerve root encroachment. The spinal canal is decompressed by the previous laminectomies. Mild bilateral facet hypertrophy contributing to mild foraminal narrowing bilaterally.   IMPRESSION: 1. Previous laminectomies at L4 and L5. 2. Broad-based foraminal and extraforaminal disc protrusion on the left at L2-3 may encroach on the left L2 nerve root beyond the foramen. 3. Small to moderate size right paracentral disc extrusion  at L5-S1 with caudal migration, mild mass effect on the thecal sac and possible right S1 nerve root encroachment. Mild foraminal narrowing bilaterally due to facet hypertrophy. 4. Moderate disc bulging at T9-10, T10-11 and T11-12 without resulting cord deformity. T2 hyperintensity within the superior endplate of QA348G, possibly discogenic but incompletely visualized by this examination. Consider further evaluation  with thoracic MRI.     Electronically Signed   By: Richardean Sale M.D.   On: 10/01/2022 13:18   She reports that she has been smoking cigarettes. She has a 19.00 pack-year smoking history. She has never used smokeless tobacco.  Recent Labs    04/22/22 0943  HGBA1C 5.9*    Objective:  VS:  HT:    WT:   BMI:     BP:   HR: bpm  TEMP: ( )  RESP:  Physical Exam Vitals and nursing note reviewed.  HENT:     Head: Normocephalic and atraumatic.     Right Ear: External ear normal.     Left Ear: External ear normal.     Nose: Nose normal.     Mouth/Throat:     Mouth: Mucous membranes are moist.  Eyes:     Extraocular Movements: Extraocular movements intact.  Cardiovascular:     Rate and Rhythm: Normal rate.     Pulses: Normal pulses.  Pulmonary:     Effort: Pulmonary effort is normal.  Abdominal:     General: Abdomen is flat. There is no distension.  Musculoskeletal:        General: Tenderness present.     Cervical back: Normal range of motion.     Comments: Patient rises from seated position to standing without difficulty. Good lumbar range of motion. No pain noted with facet loading. 5/5 strength noted with bilateral hip flexion, knee flexion/extension, ankle dorsiflexion/plantarflexion and EHL. No clonus noted bilaterally. No pain upon palpation of greater trochanters. No pain with internal/external rotation of bilateral hips. Sensation intact bilaterally. Dysesthesias noted to right L5 dermatome. Ambulates without aid, gait steady.     Skin:    General: Skin is warm and dry.     Capillary Refill: Capillary refill takes less than 2 seconds.  Neurological:     General: No focal deficit present.     Mental Status: She is oriented to person, place, and time.  Psychiatric:        Mood and Affect: Mood normal.        Behavior: Behavior normal.     Ortho Exam  Imaging: No results found.  Past Medical/Family/Surgical/Social History: Medications & Allergies reviewed per  EMR, new medications updated. Patient Active Problem List   Diagnosis Date Noted   Daytime somnolence 11/27/2021   Dream enactment behavior 11/27/2021   Obstructive sleep apnea syndrome 11/27/2021   Acquired hypothyroidism 11/27/2021   History of lumbar laminectomy for spinal cord decompression 06/04/2021   Cervical pseudoarthrosis 03/20/2021   Vasomotor symptoms due to menopause 08/16/2019   Hypothyroidism 08/10/2019   Hyperlipidemia 08/10/2019   Past Medical History:  Diagnosis Date   Thyroid disease    Family History  Problem Relation Age of Onset   Lung cancer Mother    Past Surgical History:  Procedure Laterality Date   APPENDECTOMY     67 years old   CERVICAL FUSION  2020   C5-6 ACDF   JOINT REPLACEMENT Bilateral    Hips   LUMBAR SPINE SURGERY  2020   [place]  Social History   Occupational History   Not on file  Tobacco Use   Smoking status: Every Day    Packs/day: 0.50    Years: 38.00    Additional pack years: 0.00    Total pack years: 19.00    Types: Cigarettes   Smokeless tobacco: Never  Vaping Use   Vaping Use: Never used  Substance and Sexual Activity   Alcohol use: Not on file    Comment: socially   Drug use: Not Currently   Sexual activity: Not on file

## 2022-10-21 NOTE — Progress Notes (Unsigned)
Functional Pain Scale - descriptive words and definitions  Intense (8)    Cannot complete any ADLs without much assistance/cannot concentrate/conversation is difficult/unable to sleep and unable to use distraction. Severe range order  Average Pain 9  Lower back pain. Injection did not help

## 2022-10-23 ENCOUNTER — Telehealth: Payer: Self-pay | Admitting: Physical Medicine and Rehabilitation

## 2022-10-23 NOTE — Telephone Encounter (Signed)
Attempted to call patient but voicemail not set up.

## 2022-10-23 NOTE — Telephone Encounter (Signed)
Pt called stating that the medication she was put on has been causing cramping in her hands and legs. She states she was told of possible side affects and asking for a call back from [place]. Pt phone number is [phone].

## 2022-10-24 ENCOUNTER — Other Ambulatory Visit: Payer: Self-pay | Admitting: Family Medicine

## 2022-10-24 NOTE — Telephone Encounter (Signed)
Spoke with patient and informed patient that she can stop medication

## 2022-11-04 ENCOUNTER — Ambulatory Visit (INDEPENDENT_AMBULATORY_CARE_PROVIDER_SITE_OTHER): Payer: Medicare HMO | Admitting: Physical Medicine and Rehabilitation

## 2022-11-04 ENCOUNTER — Other Ambulatory Visit: Payer: Self-pay

## 2022-11-04 VITALS — BP 120/74 | HR 61

## 2022-11-04 DIAGNOSIS — M5416 Radiculopathy, lumbar region: Secondary | ICD-10-CM | POA: Diagnosis not present

## 2022-11-04 MED ORDER — METHYLPREDNISOLONE ACETATE 80 MG/ML IJ SUSP
80.0000 mg | Freq: Once | INTRAMUSCULAR | Status: AC
  Administered 2022-11-04: 80 mg

## 2022-11-04 NOTE — Patient Instructions (Signed)

## 2022-11-04 NOTE — Progress Notes (Signed)
Functional Pain Scale - descriptive words and definitions   Severe (9)  Cannot do any ADL's even with assistance can barely talk/unable to sleep and unable to use distraction. Severe range order  Average Pain  10-11   +Driver, -BT, -Dye Allergies.

## 2022-11-08 ENCOUNTER — Telehealth: Payer: Self-pay | Admitting: Physical Medicine and Rehabilitation

## 2022-11-08 NOTE — Telephone Encounter (Signed)
Patient called asked for a call back concerning the pain she is in. Patient said her back is hurting on the left side. Patient said she can't lay on her left side.  The number to contact patient is [phone]

## 2022-11-12 ENCOUNTER — Other Ambulatory Visit: Payer: Self-pay | Admitting: Physical Medicine and Rehabilitation

## 2022-11-12 NOTE — Telephone Encounter (Signed)
Tried calling to advise. No answer.  

## 2022-11-15 NOTE — Procedures (Signed)
Lumbosacral Transforaminal Epidural Steroid Injection - Sub-Pedicular Approach with Fluoroscopic Guidance  Patient: Tina Bartlett      Date of Birth: 04/03/1956 MRN: [id number] PCP: Georganna Skeans, MD      Visit Date: 11/04/2022   Universal Protocol:    Date/Time: 11/04/2022  Consent Given By: the patient  Position: PRONE  Additional Comments: Vital signs were monitored before and after the procedure. Patient was prepped and draped in the usual sterile fashion. The correct patient, procedure, and site was verified.   Injection Procedure Details:   Procedure diagnoses: Lumbar radiculopathy [M54.16]    Meds Administered:  Meds ordered this encounter  Medications   methylPREDNISolone acetate (DEPO-MEDROL) injection 80 mg    Laterality: Right  Location/Site: L5  Needle:5.0 in., 22 ga.  Short bevel or Quincke spinal needle  Needle Placement: Transforaminal  Findings:    -Comments: Excellent flow of contrast along the nerve, nerve root and into the epidural space.  Procedure Details: After squaring off the end-plates to get a true AP view, the C-arm was positioned so that an oblique view of the foramen as noted above was visualized. The target area is just inferior to the "nose of the scotty dog" or sub pedicular. The soft tissues overlying this structure were infiltrated with 2-3 ml. of 1% Lidocaine without Epinephrine.  The spinal needle was inserted toward the target using a "trajectory" view along the fluoroscope beam.  Under AP and lateral visualization, the needle was advanced so it did not puncture dura and was located close the 6 O'Clock position of the pedical in AP tracterory. Biplanar projections were used to confirm position. Aspiration was confirmed to be negative for CSF and/or blood. A 1-2 ml. volume of Isovue-250 was injected and flow of contrast was noted at each level. Radiographs were obtained for documentation purposes.   After attaining the desired flow of  contrast documented above, a 0.5 to 1.0 ml test dose of 0.25% Marcaine was injected into each respective transforaminal space.  The patient was observed for 90 seconds post injection.  After no sensory deficits were reported, and normal lower extremity motor function was noted,   the above injectate was administered so that equal amounts of the injectate were placed at each foramen (level) into the transforaminal epidural space.   Additional Comments:  No complications occurred Dressing: 2 x 2 sterile gauze and Band-Aid    Post-procedure details: Patient was observed during the procedure. Post-procedure instructions were reviewed.  Patient left the clinic in stable condition.

## 2022-11-15 NOTE — Progress Notes (Signed)
Tina Bartlett - 67 y.o. female MRN [id number]  Date of birth: April 24, 1956  Office Visit Note: Visit Date: 11/04/2022 PCP: Georganna Skeans, MD Referred by: Georganna Skeans, MD  Subjective: Chief Complaint  Patient presents with   Lower Back - Pain   HPI:  Tina Bartlett is a 67 y.o. female who comes in today at the request of Ellin Goodie, FNP for planned Right L5-S1 Lumbar Transforaminal epidural steroid injection with fluoroscopic guidance.  The patient has failed conservative care including home exercise, medications, time and activity modification.  This injection will be diagnostic and hopefully therapeutic.  Please see requesting physician notes for further details and justification.   ROS Otherwise per HPI.  Assessment & Plan: Visit Diagnoses:    ICD-10-CM   1. Lumbar radiculopathy  M54.16 XR C-ARM NO REPORT    Epidural Steroid injection    methylPREDNISolone acetate (DEPO-MEDROL) injection 80 mg      Plan: No additional findings.   Meds & Orders:  Meds ordered this encounter  Medications   methylPREDNISolone acetate (DEPO-MEDROL) injection 80 mg    Orders Placed This Encounter  Procedures   XR C-ARM NO REPORT   Epidural Steroid injection    Follow-up: Return for visit to requesting provider as needed.   Procedures: No procedures performed  Lumbosacral Transforaminal Epidural Steroid Injection - Sub-Pedicular Approach with Fluoroscopic Guidance  Patient: Tina Bartlett      Date of Birth: August 04, 1955 MRN: [id number] PCP: Georganna Skeans, MD      Visit Date: 11/04/2022   Universal Protocol:    Date/Time: 11/04/2022  Consent Given By: the patient  Position: PRONE  Additional Comments: Vital signs were monitored before and after the procedure. Patient was prepped and draped in the usual sterile fashion. The correct patient, procedure, and site was verified.   Injection Procedure Details:   Procedure diagnoses: Lumbar radiculopathy [M54.16]    Meds  Administered:  Meds ordered this encounter  Medications   methylPREDNISolone acetate (DEPO-MEDROL) injection 80 mg    Laterality: Right  Location/Site: L5  Needle:5.0 in., 22 ga.  Short bevel or Quincke spinal needle  Needle Placement: Transforaminal  Findings:    -Comments: Excellent flow of contrast along the nerve, nerve root and into the epidural space.  Procedure Details: After squaring off the end-plates to get a true AP view, the C-arm was positioned so that an oblique view of the foramen as noted above was visualized. The target area is just inferior to the "nose of the scotty dog" or sub pedicular. The soft tissues overlying this structure were infiltrated with 2-3 ml. of 1% Lidocaine without Epinephrine.  The spinal needle was inserted toward the target using a "trajectory" view along the fluoroscope beam.  Under AP and lateral visualization, the needle was advanced so it did not puncture dura and was located close the 6 O'Clock position of the pedical in AP tracterory. Biplanar projections were used to confirm position. Aspiration was confirmed to be negative for CSF and/or blood. A 1-2 ml. volume of Isovue-250 was injected and flow of contrast was noted at each level. Radiographs were obtained for documentation purposes.   After attaining the desired flow of contrast documented above, a 0.5 to 1.0 ml test dose of 0.25% Marcaine was injected into each respective transforaminal space.  The patient was observed for 90 seconds post injection.  After no sensory deficits were reported, and normal lower extremity motor function was noted,   the above injectate was administered so that equal amounts  of the injectate were placed at each foramen (level) into the transforaminal epidural space.   Additional Comments:  No complications occurred Dressing: 2 x 2 sterile gauze and Band-Aid    Post-procedure details: Patient was observed during the procedure. Post-procedure instructions  were reviewed.  Patient left the clinic in stable condition.    Clinical History: EXAM: MRI LUMBAR SPINE WITHOUT CONTRAST   TECHNIQUE: Multiplanar, multisequence MR imaging of the lumbar spine was performed. No intravenous contrast was administered.   COMPARISON:  Lumbar spine radiographs 05/29/2021   FINDINGS: Segmentation: Conventional anatomy assumed, with the last open disc space designated L5-S1.Concordant with prior radiographs.   Alignment:  Minimal degenerative anterolisthesis at L4-5.   Vertebrae: No worrisome osseous lesion, acute fracture or pars defect. Wide bilateral laminectomies at L4 and L5. Prominent T2 hyperintensity within the superior endplate of T10, incompletely visualized by this examination.   Conus medullaris: Extends to the L2 level and appears normal.   Paraspinal and other soft tissues: No significant paraspinal findings.   Disc levels:   Sagittal images demonstrate moderate disc bulging at the T9-10, T10-11 and T11-12 levels. No significant resulting cord deformity. As above, there is T2 hyperintensity within the superior endplate of T10 which is incompletely visualized.   No significant disc space findings at T12-L1 or L1-2.   L2-3: Mild disc bulging with a broad-based foraminal and extraforaminal disc protrusion on the left. Mild facet and ligamentous hypertrophy. There is left foraminal narrowing with possible encroachment on the left L2 nerve root beyond the foramen. The spinal canal and right foramen are patent.   L3-4: Mild disc bulging, facet and ligamentous hypertrophy. No significant spinal stenosis or nerve root encroachment.   L4-5: Mild disc bulging eccentric to the left and mild bilateral facet hypertrophy. The spinal canal is decompressed by the previous laminectomies. Minimal foraminal narrowing bilaterally without definite nerve root encroachment   L5-S1: Mild disc bulging with a small to moderate right  paracentral disc extrusion demonstrating caudal migration behind the 1st sacral segment. There is mild mass effect on the thecal sac and possible right S1 nerve root encroachment. The spinal canal is decompressed by the previous laminectomies. Mild bilateral facet hypertrophy contributing to mild foraminal narrowing bilaterally.   IMPRESSION: 1. Previous laminectomies at L4 and L5. 2. Broad-based foraminal and extraforaminal disc protrusion on the left at L2-3 may encroach on the left L2 nerve root beyond the foramen. 3. Small to moderate size right paracentral disc extrusion at L5-S1 with caudal migration, mild mass effect on the thecal sac and possible right S1 nerve root encroachment. Mild foraminal narrowing bilaterally due to facet hypertrophy. 4. Moderate disc bulging at T9-10, T10-11 and T11-12 without resulting cord deformity. T2 hyperintensity within the superior endplate of T10, possibly discogenic but incompletely visualized by this examination. Consider further evaluation with thoracic MRI.     Electronically Signed   By: Carey Bullocks M.D.   On: 10/01/2022 13:18     Objective:  VS:  HT:    WT:   BMI:     BP:120/74  HR:61bpm  TEMP: ( )  RESP:  Physical Exam Vitals and nursing note reviewed.  Constitutional:      General: She is not in acute distress.    Appearance: Normal appearance. She is not ill-appearing.  HENT:     Head: Normocephalic and atraumatic.     Right Ear: External ear normal.     Left Ear: External ear normal.  Eyes:     Extraocular  Movements: Extraocular movements intact.  Cardiovascular:     Rate and Rhythm: Normal rate.     Pulses: Normal pulses.  Pulmonary:     Effort: Pulmonary effort is normal. No respiratory distress.  Abdominal:     General: There is no distension.     Palpations: Abdomen is soft.  Musculoskeletal:        General: Tenderness present.     Cervical back: Neck supple.     Right lower leg: No edema.     Left  lower leg: No edema.     Comments: Patient has good distal strength with no pain over the greater trochanters.  No clonus or focal weakness.  Skin:    Findings: No erythema, lesion or rash.  Neurological:     General: No focal deficit present.     Mental Status: She is alert and oriented to person, place, and time.     Sensory: No sensory deficit.     Motor: No weakness or abnormal muscle tone.     Coordination: Coordination normal.  Psychiatric:        Mood and Affect: Mood normal.        Behavior: Behavior normal.      Imaging: No results found.

## 2023-01-20 ENCOUNTER — Telehealth: Payer: Self-pay | Admitting: Family Medicine

## 2023-01-20 NOTE — Telephone Encounter (Signed)
Spoke with patient to schedule AWV. Patient stated she has changed PCP

## 2023-02-22 ENCOUNTER — Other Ambulatory Visit: Payer: Self-pay | Admitting: Family Medicine

## 2023-04-21 ENCOUNTER — Telehealth: Payer: Self-pay | Admitting: Physical Medicine and Rehabilitation

## 2023-04-21 NOTE — Telephone Encounter (Signed)
Patient called and wanted to make a  appointment for her left shoulder. She thinks its a pinched nerve. CB#[phone]

## 2023-04-22 ENCOUNTER — Telehealth: Payer: Self-pay | Admitting: Physical Medicine and Rehabilitation

## 2023-04-22 NOTE — Telephone Encounter (Signed)
Spoke with patient and scheduled OV for 04/23/23.

## 2023-04-22 NOTE — Telephone Encounter (Signed)
Patient called and ask could she make an appointment. CB#[phone]

## 2023-04-23 ENCOUNTER — Encounter: Payer: Self-pay | Admitting: Physical Medicine and Rehabilitation

## 2023-04-23 ENCOUNTER — Ambulatory Visit (INDEPENDENT_AMBULATORY_CARE_PROVIDER_SITE_OTHER): Payer: Medicare HMO | Admitting: Physical Medicine and Rehabilitation

## 2023-04-23 DIAGNOSIS — G894 Chronic pain syndrome: Secondary | ICD-10-CM

## 2023-04-23 DIAGNOSIS — S46212A Strain of muscle, fascia and tendon of other parts of biceps, left arm, initial encounter: Secondary | ICD-10-CM | POA: Diagnosis not present

## 2023-04-23 DIAGNOSIS — M961 Postlaminectomy syndrome, not elsewhere classified: Secondary | ICD-10-CM

## 2023-04-23 DIAGNOSIS — M7522 Bicipital tendinitis, left shoulder: Secondary | ICD-10-CM

## 2023-04-23 DIAGNOSIS — M5116 Intervertebral disc disorders with radiculopathy, lumbar region: Secondary | ICD-10-CM | POA: Diagnosis not present

## 2023-04-23 DIAGNOSIS — M25512 Pain in left shoulder: Secondary | ICD-10-CM | POA: Diagnosis not present

## 2023-04-23 DIAGNOSIS — Z981 Arthrodesis status: Secondary | ICD-10-CM

## 2023-04-23 MED ORDER — METHYLPREDNISOLONE ACETATE 40 MG/ML IJ SUSP
20.0000 mg | INTRAMUSCULAR | Status: AC | PRN
Start: 2023-04-23 — End: 2023-04-23
  Administered 2023-04-23: 20 mg via INTRA_ARTICULAR

## 2023-04-23 MED ORDER — BUPIVACAINE HCL 0.25 % IJ SOLN
2.0000 mL | INTRAMUSCULAR | Status: AC | PRN
Start: 2023-04-23 — End: 2023-04-23
  Administered 2023-04-23: 2 mL via INTRA_ARTICULAR

## 2023-04-23 NOTE — Progress Notes (Signed)
Tina Bartlett - 67 y.o. female MRN [id number]  Date of birth: March 16, 1956  Office Visit Note: Visit Date: 04/23/2023 PCP: Sarita Bottom, NP Referred by: No ref. provider found  Subjective: Chief Complaint  Patient presents with   Left Shoulder - Pain   HPI: Tina Bartlett is a 67 y.o. female who comes in today for evaluation and management several weeks of left shoulder and arm pain as well as history of chronic low back with radicular pain and known disc herniation.  Patient is followed by Dr. Burnard Bunting and Dr. Prince Solian.  We last saw her in April when she was having really significant back and leg pain with MRI findings of disc herniation.  She called in recently with more left neck and shoulder pain so we did agree to see her today not knowing really what was going on from a clinical standpoint.  Her main complaint today is a few weeks of left shoulder and bicep pain worse with shoulder flexion with a straightened bicep but also with a flexed elbow.  Elbow flexion not as painful.  She is somewhat apprehensive in general.  She had noted some swelling in the midportion of the bicep but without any bruising rash etc.  She does not remember any specific incident with pulling or lifting.  She denies any right-sided symptoms.  Her history is that she has had cervical fusion by Dr. Ophelia Charter.  She had some pseudoarthrosis.  She actually has surgery scheduled for right sided disc herniation above this fusion but that was canceled.  She had cervical epidural in the past without much relief.  That was all right-sided symptoms and this is now left.  No numbness tingling or paresthesias.  Nothing down past the elbow into the hand.  She takes gabapentin for pain control and is unable to take any other nonsteroidal anti-inflammatories or opioid medications or tramadol.  She also is unable to take Tylenol.  Secondarily her back pain has started to be a little bit of a problem but not enough for her to have  called in.  She reports the last injection did help quite a bit.  This was an L5 transforaminal injection.  She did have new MRI with known disc herniation extrusion there.  She is not really reporting leg symptoms at this point.   I spent more than 30 minutes speaking face-to-face with the patient with 50% of the time in counseling and discussing coordination of care.        Review of Systems  Musculoskeletal:  Positive for back pain, joint pain and neck pain.  All other systems reviewed and are negative.  Otherwise per HPI.  Assessment & Plan: Visit Diagnoses:    ICD-10-CM   1. Acute pain of left shoulder  M25.512     2. Strain of left biceps, initial encounter  S46.212A     3. Tendonitis of upper biceps tendon of left shoulder  M75.22     4. Intervertebral disc disorders with radiculopathy, lumbar region  M51.16     5. Post laminectomy syndrome  M96.1     6. S/P cervical spinal fusion  Z98.1     7. Chronic pain syndrome  G89.4        Plan: Findings:  1.  Several week history of acute on chronic left shoulder pain that seems to be mechanical and may be tendinitis.  She does not seem to have much in the way of impingement although she is apprehensive  with any movement.  She has known glenohumeral joint arthritis on the right.  The left sided symptoms seem to be anterior and they radiate into the anterior part of the bicep to the elbow.  Observation of both sides does show seemingly a little bit of swelling of the biceps in the mid part of the muscle.  There is no bruising etc.  No real deformity.  Because of the amount of pain she is having we did elect today to complete small steroid injection along the superior part of the biceps tendon.  I have asked her to buy some Voltaren gel to use after this depending on how quickly she can see Dr. August Saucer or Karenann Cai, PA-C.  I have asked her to make an appointment to see them as I think there to be better served in evaluating this in the  long run.  I did express that if it is more of a muscular tendon problem this hopefully would likely calm down.  I do not think this is a cervical spine she had mostly pathology on the right side prior to this.  I would let Dr. August Saucer look at that as well.  2.  In terms of her lower spine she is getting some back pain return but did extremely well with the injection back in April.  She was in extreme amount of pain back then and really was very impatient about it working but it did work and she is actually done really well.  He is starting to come back some but is not nearly as bad as it was if she is putting up with that at this point.  She continues to take gabapentin.  If this worsens she can call us and we would look at repeating the L5 transforaminal injection.    Meds & Orders: No orders of the defined types were placed in this encounter.   Orders Placed This Encounter  Procedures   Large Joint Inj    Follow-up: Return if symptoms worsen or fail to improve, for She will make appointment with Dr. August Saucer for her shoulder..   Procedures: Large Joint Inj on 04/23/2023 8:51 AM Details: 25 G 1.5 in needle, anterior approach Medications: 2 mL bupivacaine 0.25 %; 20 mg methylPREDNISolone acetate 40 MG/ML Outcome: tolerated well, no immediate complications  Local injection performed laterally to the biceps tendon without difficulty. Consent was given by the patient. Immediately prior to procedure a time out was called to verify the correct patient, procedure, equipment, support staff and site/side marked as required. Patient was prepped and draped in the usual sterile fashion.          Clinical History: EXAM: MRI LUMBAR SPINE WITHOUT CONTRAST   TECHNIQUE: Multiplanar, multisequence MR imaging of the lumbar spine was performed. No intravenous contrast was administered.   COMPARISON:  Lumbar spine radiographs 05/29/2021   FINDINGS: Segmentation: Conventional anatomy assumed, with the last  open disc space designated L5-S1.Concordant with prior radiographs.   Alignment:  Minimal degenerative anterolisthesis at L4-5.   Vertebrae: No worrisome osseous lesion, acute fracture or pars defect. Wide bilateral laminectomies at L4 and L5. Prominent T2 hyperintensity within the superior endplate of T10, incompletely visualized by this examination.   Conus medullaris: Extends to the L2 level and appears normal.   Paraspinal and other soft tissues: No significant paraspinal findings.   Disc levels:   Sagittal images demonstrate moderate disc bulging at the T9-10, T10-11 and T11-12 levels. No significant resulting cord deformity. As above,  there is T2 hyperintensity within the superior endplate of T10 which is incompletely visualized.   No significant disc space findings at T12-L1 or L1-2.   L2-3: Mild disc bulging with a broad-based foraminal and extraforaminal disc protrusion on the left. Mild facet and ligamentous hypertrophy. There is left foraminal narrowing with possible encroachment on the left L2 nerve root beyond the foramen. The spinal canal and right foramen are patent.   L3-4: Mild disc bulging, facet and ligamentous hypertrophy. No significant spinal stenosis or nerve root encroachment.   L4-5: Mild disc bulging eccentric to the left and mild bilateral facet hypertrophy. The spinal canal is decompressed by the previous laminectomies. Minimal foraminal narrowing bilaterally without definite nerve root encroachment   L5-S1: Mild disc bulging with a small to moderate right paracentral disc extrusion demonstrating caudal migration behind the 1st sacral segment. There is mild mass effect on the thecal sac and possible right S1 nerve root encroachment. The spinal canal is decompressed by the previous laminectomies. Mild bilateral facet hypertrophy contributing to mild foraminal narrowing bilaterally.   IMPRESSION: 1. Previous laminectomies at L4 and L5. 2.  Broad-based foraminal and extraforaminal disc protrusion on the left at L2-3 may encroach on the left L2 nerve root beyond the foramen. 3. Small to moderate size right paracentral disc extrusion at L5-S1 with caudal migration, mild mass effect on the thecal sac and possible right S1 nerve root encroachment. Mild foraminal narrowing bilaterally due to facet hypertrophy. 4. Moderate disc bulging at T9-10, T10-11 and T11-12 without resulting cord deformity. T2 hyperintensity within the superior endplate of T10, possibly discogenic but incompletely visualized by this examination. Consider further evaluation with thoracic MRI.     Electronically Signed   By: Carey Bullocks M.D.   On: 10/01/2022 13:18   She reports that she has been smoking cigarettes. She has a 19 pack-year smoking history. She has never used smokeless tobacco. No results for input(s): "HGBA1C", "LABURIC" in the last 8760 hours.  Objective:  VS:  HT:    WT:   BMI:     BP:   HR: bpm  TEMP: ( )  RESP:  Physical Exam Vitals and nursing note reviewed.  Constitutional:      General: She is not in acute distress.    Appearance: Normal appearance. She is well-developed. She is not ill-appearing.  HENT:     Head: Normocephalic and atraumatic.  Eyes:     Conjunctiva/sclera: Conjunctivae normal.     Pupils: Pupils are equal, round, and reactive to light.  Cardiovascular:     Rate and Rhythm: Normal rate.     Pulses: Normal pulses.  Pulmonary:     Effort: Pulmonary effort is normal.  Abdominal:     Tenderness: There is no abdominal tenderness. There is no guarding.  Musculoskeletal:        General: Tenderness present.     Cervical back: Normal range of motion. Tenderness present.     Right lower leg: No edema.     Left lower leg: No edema.     Comments: Patient has good range of motion of the cervical spine some pain with forward flexion no pain with really with extension.  Negative Spurling's test bilaterally.  She  has no trigger points in the trapezius or levator or supraspinatus bilaterally.  She has some impingement pain with rotation external and internal on both sides.  Her biggest pain is really tenderness palpating along the left superior biceps tendon.  Observing the biceps bilaterally shows  a little bit of swelling or thickness in the left bicep compared to the right.  No bruising or discontinuity etc.  Painful passive and active flexion at the shoulder.  No real pain with abduction.  No pain with elbow flexion or extension although she has apprehension.  Skin:    General: Skin is warm and dry.     Findings: No erythema or rash.  Neurological:     General: No focal deficit present.     Mental Status: She is alert and oriented to person, place, and time.     Cranial Nerves: No cranial nerve deficit.     Sensory: No sensory deficit.     Motor: No weakness or abnormal muscle tone.     Coordination: Coordination normal.     Gait: Gait normal.  Psychiatric:        Mood and Affect: Mood normal.        Behavior: Behavior normal.     Ortho Exam  Imaging: No results found.  Past Medical/Family/Surgical/Social History: Medications & Allergies reviewed per EMR, new medications updated. Patient Active Problem List   Diagnosis Date Noted   Daytime somnolence 11/27/2021   Dream enactment behavior 11/27/2021   Obstructive sleep apnea syndrome 11/27/2021   Acquired hypothyroidism 11/27/2021   History of lumbar laminectomy for spinal cord decompression 06/04/2021   Cervical pseudoarthrosis (HCC) 03/20/2021   Vasomotor symptoms due to menopause 08/16/2019   Hypothyroidism 08/10/2019   Hyperlipidemia 08/10/2019   Past Medical History:  Diagnosis Date   Thyroid disease    Family History  Problem Relation Age of Onset   Lung cancer Mother    Past Surgical History:  Procedure Laterality Date   APPENDECTOMY     67 years old   CERVICAL FUSION  2020   C5-6 [facility]   JOINT REPLACEMENT Bilateral     Hips   LUMBAR SPINE SURGERY  2020   [facility] [place]   THYROIDECTOMY     Social History   Occupational History   Not on file  Tobacco Use   Smoking status: Every Day    Current packs/day: 0.50    Average packs/day: 0.5 packs/day for 38.0 years (19.0 ttl pk-yrs)    Types: Cigarettes   Smokeless tobacco: Never  Vaping Use   Vaping status: Never Used  Substance and Sexual Activity   Alcohol use: Not on file    Comment: socially   Drug use: Not Currently   Sexual activity: Not on file

## 2023-04-23 NOTE — Progress Notes (Signed)
Functional Pain Scale - descriptive words and definitions  Distracting (5)    Aware of pain/able to complete some ADL's but limited by pain/sleep is affected and active distractions are only slightly useful. Moderate range order  Average Pain  varies  Left shoulder pain and weakness. Hard to lift arm

## 2023-05-07 ENCOUNTER — Other Ambulatory Visit: Payer: Self-pay

## 2023-05-07 ENCOUNTER — Ambulatory Visit (INDEPENDENT_AMBULATORY_CARE_PROVIDER_SITE_OTHER): Payer: Medicare HMO | Admitting: Surgical

## 2023-05-07 ENCOUNTER — Other Ambulatory Visit (INDEPENDENT_AMBULATORY_CARE_PROVIDER_SITE_OTHER): Payer: Self-pay

## 2023-05-07 DIAGNOSIS — M25512 Pain in left shoulder: Secondary | ICD-10-CM

## 2023-05-07 DIAGNOSIS — M19012 Primary osteoarthritis, left shoulder: Secondary | ICD-10-CM

## 2023-05-11 ENCOUNTER — Encounter: Payer: Self-pay | Admitting: Surgical

## 2023-05-11 MED ORDER — LIDOCAINE HCL 1 % IJ SOLN
5.0000 mL | INTRAMUSCULAR | Status: AC | PRN
Start: 2023-05-07 — End: 2023-05-07
  Administered 2023-05-07: 5 mL

## 2023-05-11 MED ORDER — METHYLPREDNISOLONE ACETATE 40 MG/ML IJ SUSP
40.0000 mg | INTRAMUSCULAR | Status: AC | PRN
Start: 2023-05-07 — End: 2023-05-07
  Administered 2023-05-07: 40 mg via INTRA_ARTICULAR

## 2023-05-11 MED ORDER — BUPIVACAINE HCL 0.25 % IJ SOLN
9.0000 mL | INTRAMUSCULAR | Status: AC | PRN
Start: 2023-05-07 — End: 2023-05-07
  Administered 2023-05-07: 9 mL via INTRA_ARTICULAR

## 2023-05-11 NOTE — Progress Notes (Signed)
Office Visit Note   Patient: Tina Bartlett           Date of Birth: June 01, 1956           MRN: [id number] Visit Date: 05/07/2023 Requested by: Sarita Bottom, NP [address] [place],  [place] [address] PCP: Sarita Bottom, NP  Subjective: Chief Complaint  Patient presents with   Lower Back - Pain    HPI: Tina Bartlett is a 67 y.o. female who presents to the office reporting left shoulder pain.  Patient reports about 2 months of left shoulder pain without history of injury.  She states that she first noticed it in bed and describes a throbbing sensation that she localizes primarily to the lateral aspect of the shoulder but does have some radiation down to the elbow.  She has numbness and tingling in the lateral shoulder region.  She also has associated scapular pain in the medial border of the scapula and neck pain itself in the same timeframe.  She feels that pain is initially worse when she lifts the arm up in front of her but if she holds the arm up above her head for an extended period of time, her pain actually improved.  She has tried taking gabapentin without much relief.  She also recently saw Dr. Alvester Morin for a different problem but mentioned her shoulder pain to him while she was there.  She had a small the steroid injection in the region of the bicep that according to her has not provided any relief.  She has been advised in the past that she needs cervical spine surgery but does not want to proceed with this and has never had surgery on her neck before.  No prior history of left shoulder surgery either.  Has MRI from 2022 of her cervical spine demonstrating C3-C4 and C4-C5 moderate left-sided foraminal stenosis..                ROS: All systems reviewed are negative as they relate to the chief complaint within the history of present illness.  Patient denies fevers or chills.  Assessment & Plan: Visit Diagnoses:  1. Arthritis of left glenohumeral joint   2. Acute pain of left  shoulder     Plan: Patient is a 67 year old female who presents for evaluation of left shoulder pain.  She has 2 months of shoulder pain that localizes to the lateral aspect of the shoulder with some symptoms of pain coming from the shoulder itself but also some suggestion of referred pain from the cervical spine especially with her history of prior MRI from 2 years ago demonstrating left-sided foraminal stenosis moderately at C3-C4 and C4-C5.  She has radiographs taken today demonstrating glenohumeral arthritis moderately of the left shoulder.  We did discuss options available to patient.   She would like to try left glenohumeral injection today and then we will get her set up for cervical spine ESI with Dr. Alvester Morin in the future.  Strongly encourage patient to pay attention to which injection is more helpful.  She understands and agrees with plan.  Under ultrasound guidance, left glenohumeral injection was successfully administered and patient tolerated procedure well.  Follow-up after cervical spine ESI if she continues to be symptomatic.  Follow-Up Instructions: No follow-ups on file.   Orders:  Orders Placed This Encounter  Procedures   XR Shoulder Left   US Guided Needle Placement - No Linked Charges   Ambulatory referral to Physical Medicine Rehab   No  orders of the defined types were placed in this encounter.     Procedures: Large Joint Inj: L glenohumeral on 05/07/2023 8:02 AM Details: 22 G 3.5 in needle, ultrasound-guided posterior approach Medications: 5 mL lidocaine 1 %; 9 mL bupivacaine 0.25 %; 40 mg methylPREDNISolone acetate 40 MG/ML Outcome: tolerated well, no immediate complications Procedure, treatment alternatives, risks and benefits explained, specific risks discussed. Consent was given by the patient. Patient was prepped and draped in the usual sterile fashion.       Clinical Data: No additional findings.  Objective: Vital Signs: There were no vitals taken for  this visit.  Physical Exam:  Constitutional: Patient appears well-developed HEENT:  Head: Normocephalic Eyes:EOM are normal Neck: Normal range of motion Cardiovascular: Normal rate Pulmonary/chest: Effort normal Neurologic: Patient is alert Skin: Skin is warm Psychiatric: Patient has normal mood and affect  Ortho Exam: Ortho exam demonstrates left shoulder with 30 degrees X rotation, 90 degrees abduction, 160 degrees forward elevation passively and actively.  She has increased pain with passive motion of the shoulder primarily at the terminal range of motion.  She has tenderness moderately over the bicep tendon in the bicipital groove.  Moderate tenderness over the [facility] joint.  There is not really any significant coarseness or grinding with passive motion of the shoulder.  She has intact rotator cuff strength with 5/5 strength of supra, infra, subscap.  No bruising or cellulitis noted.  No Popeye deformity noted.  She has intact EPL, FPL, finger abduction, pronation/supination, bicep, tricep, deltoid rated 5/5.  Axillary nerve is intact with deltoid firing.  She does have tenderness through the axial cervical spine and increased pain with cervical spine range of motion.  Negative Spurling sign.  Specialty Comments:  EXAM: MRI LUMBAR SPINE WITHOUT CONTRAST   TECHNIQUE: Multiplanar, multisequence MR imaging of the lumbar spine was performed. No intravenous contrast was administered.   COMPARISON:  Lumbar spine radiographs 05/29/2021   FINDINGS: Segmentation: Conventional anatomy assumed, with the last open disc space designated L5-S1.Concordant with prior radiographs.   Alignment:  Minimal degenerative anterolisthesis at L4-5.   Vertebrae: No worrisome osseous lesion, acute fracture or pars defect. Wide bilateral laminectomies at L4 and L5. Prominent T2 hyperintensity within the superior endplate of T10, incompletely visualized by this examination.   Conus medullaris: Extends to the  L2 level and appears normal.   Paraspinal and other soft tissues: No significant paraspinal findings.   Disc levels:   Sagittal images demonstrate moderate disc bulging at the T9-10, T10-11 and T11-12 levels. No significant resulting cord deformity. As above, there is T2 hyperintensity within the superior endplate of T10 which is incompletely visualized.   No significant disc space findings at T12-L1 or L1-2.   L2-3: Mild disc bulging with a broad-based foraminal and extraforaminal disc protrusion on the left. Mild facet and ligamentous hypertrophy. There is left foraminal narrowing with possible encroachment on the left L2 nerve root beyond the foramen. The spinal canal and right foramen are patent.   L3-4: Mild disc bulging, facet and ligamentous hypertrophy. No significant spinal stenosis or nerve root encroachment.   L4-5: Mild disc bulging eccentric to the left and mild bilateral facet hypertrophy. The spinal canal is decompressed by the previous laminectomies. Minimal foraminal narrowing bilaterally without definite nerve root encroachment   L5-S1: Mild disc bulging with a small to moderate right paracentral disc extrusion demonstrating caudal migration behind the 1st sacral segment. There is mild mass effect on the thecal sac and possible right S1 nerve  root encroachment. The spinal canal is decompressed by the previous laminectomies. Mild bilateral facet hypertrophy contributing to mild foraminal narrowing bilaterally.   IMPRESSION: 1. Previous laminectomies at L4 and L5. 2. Broad-based foraminal and extraforaminal disc protrusion on the left at L2-3 may encroach on the left L2 nerve root beyond the foramen. 3. Small to moderate size right paracentral disc extrusion at L5-S1 with caudal migration, mild mass effect on the thecal sac and possible right S1 nerve root encroachment. Mild foraminal narrowing bilaterally due to facet hypertrophy. 4. Moderate disc bulging  at T9-10, T10-11 and T11-12 without resulting cord deformity. T2 hyperintensity within the superior endplate of T10, possibly discogenic but incompletely visualized by this examination. Consider further evaluation with thoracic MRI.     Electronically Signed   By: Carey Bullocks M.D.   On: 10/01/2022 13:18  Imaging: No results found.   PMFS History: Patient Active Problem List   Diagnosis Date Noted   Daytime somnolence 11/27/2021   Dream enactment behavior 11/27/2021   Obstructive sleep apnea syndrome 11/27/2021   Acquired hypothyroidism 11/27/2021   History of lumbar laminectomy for spinal cord decompression 06/04/2021   Cervical pseudoarthrosis (HCC) 03/20/2021   Vasomotor symptoms due to menopause 08/16/2019   Hypothyroidism 08/10/2019   Hyperlipidemia 08/10/2019   Past Medical History:  Diagnosis Date   Thyroid disease     Family History  Problem Relation Age of Onset   Lung cancer Mother     Past Surgical History:  Procedure Laterality Date   APPENDECTOMY     67 years old   CERVICAL FUSION  2020   C5-6 ACDF   JOINT REPLACEMENT Bilateral    Hips   LUMBAR SPINE SURGERY  2020   [facility] [place]   THYROIDECTOMY     Social History   Occupational History   Not on file  Tobacco Use   Smoking status: Every Day    Current packs/day: 0.50    Average packs/day: 0.5 packs/day for 38.0 years (19.0 ttl pk-yrs)    Types: Cigarettes   Smokeless tobacco: Never  Vaping Use   Vaping status: Never Used  Substance and Sexual Activity   Alcohol use: Not on file    Comment: socially   Drug use: Not Currently   Sexual activity: Not on file

## 2023-06-11 ENCOUNTER — Ambulatory Visit (INDEPENDENT_AMBULATORY_CARE_PROVIDER_SITE_OTHER): Payer: Medicare HMO | Admitting: Surgical

## 2023-06-11 DIAGNOSIS — M5412 Radiculopathy, cervical region: Secondary | ICD-10-CM | POA: Diagnosis not present

## 2023-06-11 DIAGNOSIS — M19012 Primary osteoarthritis, left shoulder: Secondary | ICD-10-CM

## 2023-06-11 MED ORDER — IBUPROFEN 800 MG PO TABS: 800.0000 mg | ORAL_TABLET | Freq: Three times a day (TID) | ORAL | 0 refills | Status: DC | PRN

## 2023-06-13 ENCOUNTER — Encounter: Payer: Self-pay | Admitting: Surgical

## 2023-06-13 NOTE — Progress Notes (Signed)
Follow-up Office Visit Note   Patient: Tina Bartlett           Date of Birth: 12-18-1955           MRN: [id number] Visit Date: 06/11/2023 Requested by: Sarita Bottom, NP [address] [place],  [place] [address] PCP: Sarita Bottom, NP  Subjective: Chief Complaint  Patient presents with   Right Arm - Pain   Left Arm - Pain    HPI: Tina Bartlett is a 67 y.o. female who returns to the office for follow-up visit.    Plan at last visit was: Patient is a 67 year old female who presents for evaluation of left shoulder pain.  She has 2 months of shoulder pain that localizes to the lateral aspect of the shoulder with some symptoms of pain coming from the shoulder itself but also some suggestion of referred pain from the cervical spine especially with her history of prior MRI from 2 years ago demonstrating left-sided foraminal stenosis moderately at C3-C4 and C4-C5.  She has radiographs taken today demonstrating glenohumeral arthritis moderately of the left shoulder.  We did discuss options available to patient.   She would like to try left glenohumeral injection today and then we will get her set up for cervical spine ESI with Dr. Alvester Morin in the future.  Strongly encourage patient to pay attention to which injection is more helpful.  She understands and agrees with plan.  Under ultrasound guidance, left glenohumeral injection was successfully administered and patient tolerated procedure well.  Follow-up after cervical spine ESI if she continues to be symptomatic   Since then, patient notes she had about 40% relief from left glenohumeral injection but still has radicular pain down the entirety of the left arm into her fingers as well as numbness and tingling in this distribution as well.  She also has pain that is reproduced and tingling that is reproduced in the arm with neck range of motion.  She has been approved for cervical spine ESI by her insurance but has not scheduled appointment yet.               ROS: All systems reviewed are negative as they relate to the chief complaint within the history of present illness.  Patient denies fevers or chills.  Assessment & Plan: Visit Diagnoses:  1. Radiculopathy, cervical region   2. Arthritis of left glenohumeral joint     Plan: Tina Bartlett is a 67 y.o. female who returns to the office for follow-up visit.  Plan from last visit was noted above in HPI.  They now return with partial relief from left glenohumeral injection from last visit.  Has not had cervical spine ESI yet.  Did prescribe Motrin 800 mg per patient's request and she was set up for cervical spine ESI with Dr. Alvester Morin to be done in early December.  Think that this is going to be more helpful than the glenohumeral injection based on her distribution of symptoms.  Follow-up after ESI if symptoms do not substantially improve.  Follow-Up Instructions: No follow-ups on file.   Orders:  No orders of the defined types were placed in this encounter.  Meds ordered this encounter  Medications   ibuprofen (ADVIL) 800 MG tablet    Sig: Take 1 tablet (800 mg total) by mouth every 8 (eight) hours as needed.    Dispense:  60 tablet    Refill:  0      Procedures: No procedures performed   Clinical Data:  No additional findings.  Objective: Vital Signs: There were no vitals taken for this visit.  Physical Exam:  Constitutional: Patient appears well-developed HEENT:  Head: Normocephalic Eyes:EOM are normal Neck: Normal range of motion Cardiovascular: Normal rate Pulmonary/chest: Effort normal Neurologic: Patient is alert Skin: Skin is warm Psychiatric: Patient has normal mood and affect  Ortho Exam: Ortho exam demonstrates left shoulder region with no cellulitis or skin changes noted.  Negative Spurling sign but positive Lhermitte sign reproducing left arm radicular pain and numbness.  Intact strength of bilateral upper extremities of EPL, FPL, finger abduction, grip strength  testing, pronation/supination, bicep, tricep, deltoid.  Specialty Comments:  CLINICAL DATA:  MR cervical spine eval for source of radicular arm pain   EXAM: MRI CERVICAL SPINE WITHOUT CONTRAST   TECHNIQUE: Multiplanar, multisequence MR imaging of the cervical spine was performed. No intravenous contrast was administered.   COMPARISON:  CT of the cervical spine 02/15/2021.   FINDINGS: Alignment: Straightening of the normal cervical lordosis. No substantial sagittal subluxation.   Vertebrae: Vertebral body heights are maintained. No focal marrow edema to suggest acute fracture discitis/osteomyelitis. C5-C7 ACDF.   Cord: Normal cord signal.   Posterior Fossa, vertebral arteries, paraspinal tissues: Visualized vertebral artery flow voids are maintained. Unremarkable visualized posterior fossa. No paraspinal edema.   Disc levels:   C2-C3: No significant disc protrusion, foraminal stenosis, or canal stenosis.   C3-C4: Right paracentral disc protrusion deforms the right ventral cord with moderate canal stenosis. Left greater than right facet and uncovertebral hypertrophy with moderate left foraminal stenosis. No significant right foraminal stenosis.   C4-C5: Posterior disc osteophyte complex with central annular fissure. Mild canal stenosis. Left greater than right facet and uncovertebral degenerative with moderate left and mild right foraminal stenosis.   C5-C6: ACDF. Right greater than left facet and uncovertebral hypertrophy. Moderate right foraminal stenosis. No significant canal or left foraminal stenosis.   C6-C7: ACDF. Slight disc bulging. Right greater than left facet and uncovertebral hypertrophy. Resulting moderate right foraminal stenosis. No significant canal or left foraminal stenosis.   C7-T1: No significant disc protrusion, foraminal stenosis, or canal stenosis.   IMPRESSION: 1. At C3-C4, right paracentral disc protrusion deforms the right ventral cord  with moderate canal stenosis. Moderate left foraminal stenosis. 2. At C4-C5, mild canal stenosis with moderate left and mild right foraminal stenosis. 3. C5-C7 ACDF with moderate right foraminal stenosis at C5-C6 and C6-C7. Patent canal.     Electronically Signed   By: Feliberto Harts M.D.   On: 05/11/2021 09:42  Imaging: No results found.   PMFS History: Patient Active Problem List   Diagnosis Date Noted   Daytime somnolence 11/27/2021   Dream enactment behavior 11/27/2021   Obstructive sleep apnea syndrome 11/27/2021   Acquired hypothyroidism 11/27/2021   History of lumbar laminectomy for spinal cord decompression 06/04/2021   Cervical pseudoarthrosis (HCC) 03/20/2021   Vasomotor symptoms due to menopause 08/16/2019   Hypothyroidism 08/10/2019   Hyperlipidemia 08/10/2019   Past Medical History:  Diagnosis Date   Thyroid disease     Family History  Problem Relation Age of Onset   Lung cancer Mother     Past Surgical History:  Procedure Laterality Date   APPENDECTOMY     67 years old   CERVICAL FUSION  2020   C5-6 ACDF   JOINT REPLACEMENT Bilateral    Hips   LUMBAR SPINE SURGERY  2020   [facility] [place]   THYROIDECTOMY     Social History  Occupational History   Not on file  Tobacco Use   Smoking status: Every Day    Current packs/day: 0.50    Average packs/day: 0.5 packs/day for 38.0 years (19.0 ttl pk-yrs)    Types: Cigarettes   Smokeless tobacco: Never  Vaping Use   Vaping status: Never Used  Substance and Sexual Activity   Alcohol use: Not on file    Comment: socially   Drug use: Not Currently   Sexual activity: Not on file

## 2023-06-24 ENCOUNTER — Other Ambulatory Visit: Payer: Self-pay

## 2023-06-24 ENCOUNTER — Other Ambulatory Visit: Payer: Self-pay | Admitting: Family Medicine

## 2023-06-24 ENCOUNTER — Other Ambulatory Visit: Payer: Self-pay | Admitting: Surgical

## 2023-06-24 ENCOUNTER — Encounter: Payer: Self-pay | Admitting: Physical Medicine and Rehabilitation

## 2023-06-24 ENCOUNTER — Ambulatory Visit: Payer: Medicare HMO | Admitting: Physical Medicine and Rehabilitation

## 2023-06-24 DIAGNOSIS — M5412 Radiculopathy, cervical region: Secondary | ICD-10-CM | POA: Diagnosis not present

## 2023-06-24 MED ORDER — METHYLPREDNISOLONE ACETATE 40 MG/ML IJ SUSP: 40.0000 mg | Freq: Once | INTRAMUSCULAR | Status: DC

## 2023-06-24 MED ORDER — METHYLPREDNISOLONE ACETATE 40 MG/ML IJ SUSP
40.0000 mg | Freq: Once | INTRAMUSCULAR | Status: AC
  Administered 2023-06-24: 40 mg

## 2023-06-24 NOTE — Progress Notes (Signed)
Functional Pain Scale - descriptive words and definitions  Immobilizing (10)   Unable to move or talk due to intensity of pain/unable to sleep and unable to use distraction. Severe range order  Average Pain 10  Patient states that her neck is terrible.  States that pain is on the left side of her neck and radiates down into her left shoulder.  States the pain wakes her up at night.  Takes Motrin and Gabapentin.    +Driver, -BT, -Dye Allergies.

## 2023-06-24 NOTE — Patient Instructions (Signed)

## 2023-07-01 ENCOUNTER — Telehealth: Payer: Self-pay | Admitting: Physical Medicine and Rehabilitation

## 2023-07-01 NOTE — Telephone Encounter (Signed)
Pt called in stating pain in back is getting worse pt had last injection 06/24/23

## 2023-07-01 NOTE — Progress Notes (Signed)
Tina Bartlett - 67 y.o. female MRN [id number]  Date of birth: Feb 12, 1956  Office Visit Note: Visit Date: 06/24/2023 PCP: Sarita Bottom, NP Referred by: Sarita Bottom, NP  Subjective: Chief Complaint  Patient presents with   Neck - Pain   HPI:  Loghan Mariconda is a 67 y.o. female who comes in today at the request of Karenann Cai, PA-C for planned Right C7-T1 Cervical Interlaminar epidural steroid injection with fluoroscopic guidance.  The patient has failed conservative care including home exercise, medications, time and activity modification.  This injection will be diagnostic and hopefully therapeutic.  Please see requesting physician notes for further details and justification.   ROS Otherwise per HPI.  Assessment & Plan: Visit Diagnoses:    ICD-10-CM   1. Radiculopathy, cervical region  M54.12 XR C-ARM NO REPORT    Epidural Steroid injection    methylPREDNISolone acetate (DEPO-MEDROL) injection 40 mg    DISCONTINUED: methylPREDNISolone acetate (DEPO-MEDROL) injection 40 mg      Plan: No additional findings.   Meds & Orders:  Meds ordered this encounter  Medications   DISCONTD: methylPREDNISolone acetate (DEPO-MEDROL) injection 40 mg   methylPREDNISolone acetate (DEPO-MEDROL) injection 40 mg    Orders Placed This Encounter  Procedures   XR C-ARM NO REPORT   Epidural Steroid injection    Follow-up: Return for visit to requesting provider as needed.   Procedures: No procedures performed  Cervical Epidural Steroid Injection - Interlaminar Approach with Fluoroscopic Guidance  Patient: Haleigha Picardo      Date of Birth: 03/26/1956 MRN: [id number] PCP: Sarita Bottom, NP      Visit Date: 06/24/2023   Universal Protocol:    Date/Time: 12/10/244:35 PM  Consent Given By: the patient  Position: PRONE  Additional Comments: Vital signs were monitored before and after the procedure. Patient was prepped and draped in the usual sterile fashion. The correct patient,  procedure, and site was verified.   Injection Procedure Details:   Procedure diagnoses: Radiculopathy, cervical region [M54.12]    Meds Administered:  Meds ordered this encounter  Medications   DISCONTD: methylPREDNISolone acetate (DEPO-MEDROL) injection 40 mg   methylPREDNISolone acetate (DEPO-MEDROL) injection 40 mg     Laterality: Right  Location/Site: C7-T1  Needle: 3.5 in., 20 ga. Tuohy  Needle Placement: Paramedian epidural space  Findings:  -Comments: Excellent flow of contrast into the epidural space.  Procedure Details: Using a paramedian approach from the side mentioned above, the region overlying the inferior lamina was localized under fluoroscopic visualization and the soft tissues overlying this structure were infiltrated with 4 ml. of 1% Lidocaine without Epinephrine. A # 20 gauge, Tuohy needle was inserted into the epidural space using a paramedian approach.  The epidural space was localized using loss of resistance along with contralateral oblique bi-planar fluoroscopic views.  After negative aspirate for air, blood, and CSF, a 2 ml. volume of Isovue-250 was injected into the epidural space and the flow of contrast was observed. Radiographs were obtained for documentation purposes.   The injectate was administered into the level noted above.  Additional Comments:  No complications occurred Dressing: 2 x 2 sterile gauze and Band-Aid    Post-procedure details: Patient was observed during the procedure. Post-procedure instructions were reviewed.  Patient left the clinic in stable condition.   Clinical History: CLINICAL DATA:  MR cervical spine eval for source of radicular arm pain   EXAM: MRI CERVICAL SPINE WITHOUT CONTRAST   TECHNIQUE: Multiplanar, multisequence MR imaging of the cervical spine  was performed. No intravenous contrast was administered.   COMPARISON:  CT of the cervical spine 02/15/2021.   FINDINGS: Alignment: Straightening of the  normal cervical lordosis. No substantial sagittal subluxation.   Vertebrae: Vertebral body heights are maintained. No focal marrow edema to suggest acute fracture discitis/osteomyelitis. C5-C7 ACDF.   Cord: Normal cord signal.   Posterior Fossa, vertebral arteries, paraspinal tissues: Visualized vertebral artery flow voids are maintained. Unremarkable visualized posterior fossa. No paraspinal edema.   Disc levels:   C2-C3: No significant disc protrusion, foraminal stenosis, or canal stenosis.   C3-C4: Right paracentral disc protrusion deforms the right ventral cord with moderate canal stenosis. Left greater than right facet and uncovertebral hypertrophy with moderate left foraminal stenosis. No significant right foraminal stenosis.   C4-C5: Posterior disc osteophyte complex with central annular fissure. Mild canal stenosis. Left greater than right facet and uncovertebral degenerative with moderate left and mild right foraminal stenosis.   C5-C6: ACDF. Right greater than left facet and uncovertebral hypertrophy. Moderate right foraminal stenosis. No significant canal or left foraminal stenosis.   C6-C7: ACDF. Slight disc bulging. Right greater than left facet and uncovertebral hypertrophy. Resulting moderate right foraminal stenosis. No significant canal or left foraminal stenosis.   C7-T1: No significant disc protrusion, foraminal stenosis, or canal stenosis.   IMPRESSION: 1. At C3-C4, right paracentral disc protrusion deforms the right ventral cord with moderate canal stenosis. Moderate left foraminal stenosis. 2. At C4-C5, mild canal stenosis with moderate left and mild right foraminal stenosis. 3. C5-C7 ACDF with moderate right foraminal stenosis at C5-C6 and C6-C7. Patent canal.     Electronically Signed   By: Feliberto Harts M.D.   On: 05/11/2021 09:42     Objective:  VS:  HT:    WT:   BMI:     BP:   HR: bpm  TEMP: ( )  RESP:  Physical Exam Vitals  and nursing note reviewed.  Constitutional:      General: She is not in acute distress.    Appearance: Normal appearance. She is not ill-appearing.  HENT:     Head: Normocephalic and atraumatic.     Right Ear: External ear normal.     Left Ear: External ear normal.  Eyes:     Extraocular Movements: Extraocular movements intact.  Cardiovascular:     Rate and Rhythm: Normal rate.     Pulses: Normal pulses.  Musculoskeletal:     Cervical back: Tenderness present. No rigidity.     Right lower leg: No edema.     Left lower leg: No edema.     Comments: Patient has good strength in the upper extremities including 5 out of 5 strength in wrist extension long finger flexion and APB.  There is no atrophy of the hands intrinsically.  There is a negative Hoffmann's test.   Lymphadenopathy:     Cervical: No cervical adenopathy.  Skin:    Findings: No erythema, lesion or rash.  Neurological:     General: No focal deficit present.     Mental Status: She is alert and oriented to person, place, and time.     Sensory: No sensory deficit.     Motor: No weakness or abnormal muscle tone.     Coordination: Coordination normal.  Psychiatric:        Mood and Affect: Mood normal.        Behavior: Behavior normal.      Imaging: No results found.

## 2023-07-01 NOTE — Procedures (Signed)
Cervical Epidural Steroid Injection - Interlaminar Approach with Fluoroscopic Guidance  Patient: Tina Bartlett      Date of Birth: [birth year] MRN: [id number] PCP: Sarita Bottom, NP      Visit Date: 06/24/2023   Universal Protocol:    Date/Time: 12/10/244:35 PM  Consent Given By: the patient  Position: PRONE  Additional Comments: Vital signs were monitored before and after the procedure. Patient was prepped and draped in the usual sterile fashion. The correct patient, procedure, and site was verified.   Injection Procedure Details:   Procedure diagnoses: Radiculopathy, cervical region [M54.12]    Meds Administered:  Meds ordered this encounter  Medications   DISCONTD: methylPREDNISolone acetate (DEPO-MEDROL) injection 40 mg   methylPREDNISolone acetate (DEPO-MEDROL) injection 40 mg     Laterality: Right  Location/Site: C7-T1  Needle: 3.5 in., 20 ga. Tuohy  Needle Placement: Paramedian epidural space  Findings:  -Comments: Excellent flow of contrast into the epidural space.  Procedure Details: Using a paramedian approach from the side mentioned above, the region overlying the inferior lamina was localized under fluoroscopic visualization and the soft tissues overlying this structure were infiltrated with 4 ml. of 1% Lidocaine without Epinephrine. A # 20 gauge, Tuohy needle was inserted into the epidural space using a paramedian approach.  The epidural space was localized using loss of resistance along with contralateral oblique bi-planar fluoroscopic views.  After negative aspirate for air, blood, and CSF, a 2 ml. volume of Isovue-250 was injected into the epidural space and the flow of contrast was observed. Radiographs were obtained for documentation purposes.   The injectate was administered into the level noted above.  Additional Comments:  No complications occurred Dressing: 2 x 2 sterile gauze and Band-Aid    Post-procedure details: Patient was observed  during the procedure. Post-procedure instructions were reviewed.  Patient left the clinic in stable condition.

## 2023-07-21 ENCOUNTER — Encounter: Payer: Self-pay | Admitting: Surgical

## 2023-07-21 ENCOUNTER — Ambulatory Visit: Payer: Medicare HMO | Admitting: Surgical

## 2023-07-21 DIAGNOSIS — M25512 Pain in left shoulder: Secondary | ICD-10-CM | POA: Diagnosis not present

## 2023-07-21 MED ORDER — TRAMADOL HCL 50 MG PO TABS: 50.0000 mg | ORAL_TABLET | Freq: Two times a day (BID) | ORAL | 0 refills | Status: DC | PRN

## 2023-07-21 NOTE — Progress Notes (Cosign Needed)
Follow-up Office Visit Note   Patient: Tina Bartlett           Date of Birth: 05-25-1956           MRN: [id number] Visit Date: 07/21/2023 Requested by: Sarita Bottom, NP [address] [place],  [place] [address] PCP: Sarita Bottom, NP  Subjective: Chief Complaint  Patient presents with   Neck - Follow-up    HPI: Tina Bartlett is a 67 y.o. female who returns to the office for follow-up visit.    Plan at last visit was: Tina Bartlett is a 66 y.o. female who returns to the office for follow-up visit.  Plan from last visit was noted above in HPI.  They now return with partial relief from left glenohumeral injection from last visit.  Has not had cervical spine ESI yet.  Did prescribe Motrin 800 mg per patient's request and she was set up for cervical spine ESI with Dr. Alvester Morin to be done in early December.  Think that this is going to be more helpful than the glenohumeral injection based on her distribution of symptoms.  Follow-up after ESI if symptoms do not substantially improve.   Since then, patient notes continued pain with no significant improvement for even a day from cervical spine ESI.  She did have more relief from glenohumeral injection that was administered previously.  She describes severe left shoulder pain that radiates primarily to the elbow.  She also has occasional numbness and tingling in her bilateral feet and bilateral hands though this is only really been bothersome when her pain is at its worst.  She did have flareup of pain last week after cooking a large meal for Christmas for her family.  She also had flareup of her low back pain with radiation down to the calves and into the buttock region.  The back pain has significantly calm down back to baseline as of today but the left shoulder persists.  She has difficulty getting sleep.  She cannot really lift her left arm up to her head like she can on her right side.  She is right-hand dominant.  She is using gabapentin, Motrin,  lidocaine patches without any significant relief.  Denies any history of prior surgery to the left shoulder.  She has never had advanced imaging of the left shoulder but she has had MRI of the right shoulder about 2 years ago that demonstrated moderate glenohumeral arthritis with full-thickness supraspinatus tear.              ROS: All systems reviewed are negative as they relate to the chief complaint within the history of present illness.  Patient denies fevers or chills.  Assessment & Plan: Visit Diagnoses:  1. Acute pain of left shoulder     Plan: Tina Bartlett is a 67 y.o. female who returns to the office for follow-up visit.  Plan from last visit was noted above in HPI.  They now return with no relief from cervical spine ESI for any amount of time.  Did have about 40% relief from glenohumeral injection that was administered on 05/07/2023.  With continued symptoms that seem to be mostly localizing to the shoulder today by her history and by the response to the various injections, next step is MRI arthrogram of the left shoulder to further evaluate the extent of the arthritis of her shoulder as well as evaluate for rotator cuff tear.  She does have full-thickness supraspinatus tear on the right shoulder that is currently  asymptomatic but has been symptomatic in the past.  One-time prescription for tramadol prescribed in order to help with pain and help with sleeping at night.  Follow-up after MRI to review results.  Follow-Up Instructions: No follow-ups on file.   Orders:  Orders Placed This Encounter  Procedures   MR Shoulder Left w/ contrast   Arthrogram   Meds ordered this encounter  Medications   traMADol (ULTRAM) 50 MG tablet    Sig: Take 1 tablet (50 mg total) by mouth every 12 (twelve) hours as needed.    Dispense:  20 tablet    Refill:  0      Procedures: No procedures performed   Clinical Data: No additional findings.  Objective: Vital Signs: There were no vitals  taken for this visit.  Physical Exam:  Constitutional: Patient appears well-developed HEENT:  Head: Normocephalic Eyes:EOM are normal Neck: Normal range of motion Cardiovascular: Normal rate Pulmonary/chest: Effort normal Neurologic: Patient is alert Skin: Skin is warm Psychiatric: Patient has normal mood and affect  Ortho Exam: Ortho exam demonstrates left shoulder with pain with passive motion of the shoulder.  Range of motion not able to be appropriately estimated due to patient's pain level.  Axillary nerve is intact with deltoid firing.  She has infraspinatus strength rated 4/5 along with supraspinatus strength.  Subscap strength rated 5/5.  Tenderness severely over the bicipital groove.  No tenderness over the [facility] joint.  Intact EPL, FPL, finger abduction, pronation/supination, bicep, tricep, deltoid.  Does have reproduced pain with bicep flexion and supination strength testing.  Positive O'Brien sign.  There is some coarseness and grinding that is palpable with passive motion of the shoulder.  She has significantly reduced active range of motion of the shoulder with only about 45 degrees of active abduction and 50 degrees of active forward elevation.  Specialty Comments:  CLINICAL DATA:  MR cervical spine eval for source of radicular arm pain   EXAM: MRI CERVICAL SPINE WITHOUT CONTRAST   TECHNIQUE: Multiplanar, multisequence MR imaging of the cervical spine was performed. No intravenous contrast was administered.   COMPARISON:  CT of the cervical spine 02/15/2021.   FINDINGS: Alignment: Straightening of the normal cervical lordosis. No substantial sagittal subluxation.   Vertebrae: Vertebral body heights are maintained. No focal marrow edema to suggest acute fracture discitis/osteomyelitis. C5-C7 ACDF.   Cord: Normal cord signal.   Posterior Fossa, vertebral arteries, paraspinal tissues: Visualized vertebral artery flow voids are maintained. Unremarkable  visualized posterior fossa. No paraspinal edema.   Disc levels:   C2-C3: No significant disc protrusion, foraminal stenosis, or canal stenosis.   C3-C4: Right paracentral disc protrusion deforms the right ventral cord with moderate canal stenosis. Left greater than right facet and uncovertebral hypertrophy with moderate left foraminal stenosis. No significant right foraminal stenosis.   C4-C5: Posterior disc osteophyte complex with central annular fissure. Mild canal stenosis. Left greater than right facet and uncovertebral degenerative with moderate left and mild right foraminal stenosis.   C5-C6: ACDF. Right greater than left facet and uncovertebral hypertrophy. Moderate right foraminal stenosis. No significant canal or left foraminal stenosis.   C6-C7: ACDF. Slight disc bulging. Right greater than left facet and uncovertebral hypertrophy. Resulting moderate right foraminal stenosis. No significant canal or left foraminal stenosis.   C7-T1: No significant disc protrusion, foraminal stenosis, or canal stenosis.   IMPRESSION: 1. At C3-C4, right paracentral disc protrusion deforms the right ventral cord with moderate canal stenosis. Moderate left foraminal stenosis. 2. At C4-C5, mild canal stenosis  with moderate left and mild right foraminal stenosis. 3. C5-C7 ACDF with moderate right foraminal stenosis at C5-C6 and C6-C7. Patent canal.     Electronically Signed   By: Feliberto Harts M.D.   On: 05/11/2021 09:42  Imaging: No results found.   PMFS History: Patient Active Problem List   Diagnosis Date Noted   Daytime somnolence 11/27/2021   Dream enactment behavior 11/27/2021   Obstructive sleep apnea syndrome 11/27/2021   Acquired hypothyroidism 11/27/2021   History of lumbar laminectomy for spinal cord decompression 06/04/2021   Cervical pseudoarthrosis (HCC) 03/20/2021   Vasomotor symptoms due to menopause 08/16/2019   Hypothyroidism 08/10/2019    Hyperlipidemia 08/10/2019   Past Medical History:  Diagnosis Date   Thyroid disease     Family History  Problem Relation Age of Onset   Lung cancer Mother     Past Surgical History:  Procedure Laterality Date   APPENDECTOMY     67 years old   CERVICAL FUSION  2020   C5-6 ACDF   JOINT REPLACEMENT Bilateral    Hips   LUMBAR SPINE SURGERY  2020   [facility] [place]   THYROIDECTOMY     Social History   Occupational History   Not on file  Tobacco Use   Smoking status: Every Day    Current packs/day: 0.50    Average packs/day: 0.5 packs/day for 38.0 years (19.0 ttl pk-yrs)    Types: Cigarettes   Smokeless tobacco: Never  Vaping Use   Vaping status: Never Used  Substance and Sexual Activity   Alcohol use: Not on file    Comment: socially   Drug use: Not Currently   Sexual activity: Not on file

## 2023-07-29 ENCOUNTER — Other Ambulatory Visit: Payer: Self-pay | Admitting: Surgical

## 2023-07-29 ENCOUNTER — Telehealth: Payer: Self-pay | Admitting: Orthopedic Surgery

## 2023-07-29 MED ORDER — TRAMADOL HCL 50 MG PO TABS: 50.0000 mg | ORAL_TABLET | Freq: Every evening | ORAL | 0 refills | Status: AC | PRN

## 2023-07-29 NOTE — Telephone Encounter (Signed)
 Patient called needing Rx refilled Tramadol. The number to contact patient is [phone]

## 2023-07-29 NOTE — Telephone Encounter (Signed)
 Refilled. This should be last refill until we can figure out what's going on with her MRI scan later this month

## 2023-07-30 ENCOUNTER — Observation Stay (HOSPITAL_COMMUNITY)
Admission: EM | Admit: 2023-07-30 | Discharge: 2023-07-31 | Disposition: A | Discharge status: Home / Self Care | Payer: Medicare HMO | Attending: Internal Medicine | Admitting: Internal Medicine

## 2023-07-30 ENCOUNTER — Emergency Department (HOSPITAL_COMMUNITY): Payer: Medicare HMO

## 2023-07-30 ENCOUNTER — Other Ambulatory Visit: Payer: Self-pay

## 2023-07-30 ENCOUNTER — Encounter (HOSPITAL_COMMUNITY): Payer: Self-pay

## 2023-07-30 DIAGNOSIS — M6281 Muscle weakness (generalized): Secondary | ICD-10-CM | POA: Insufficient documentation

## 2023-07-30 DIAGNOSIS — G4733 Obstructive sleep apnea (adult) (pediatric): Secondary | ICD-10-CM | POA: Diagnosis present

## 2023-07-30 DIAGNOSIS — E785 Hyperlipidemia, unspecified: Secondary | ICD-10-CM | POA: Diagnosis present

## 2023-07-30 DIAGNOSIS — I10 Essential (primary) hypertension: Secondary | ICD-10-CM | POA: Diagnosis not present

## 2023-07-30 DIAGNOSIS — T50901A Poisoning by unspecified drugs, medicaments and biological substances, accidental (unintentional), initial encounter: Secondary | ICD-10-CM | POA: Diagnosis present

## 2023-07-30 DIAGNOSIS — Z79899 Other long term (current) drug therapy: Secondary | ICD-10-CM | POA: Insufficient documentation

## 2023-07-30 DIAGNOSIS — F17210 Nicotine dependence, cigarettes, uncomplicated: Secondary | ICD-10-CM | POA: Diagnosis not present

## 2023-07-30 DIAGNOSIS — E039 Hypothyroidism, unspecified: Secondary | ICD-10-CM | POA: Diagnosis present

## 2023-07-30 DIAGNOSIS — G9341 Metabolic encephalopathy: Principal | ICD-10-CM | POA: Insufficient documentation

## 2023-07-30 DIAGNOSIS — G9340 Encephalopathy, unspecified: Secondary | ICD-10-CM

## 2023-07-30 DIAGNOSIS — R5383 Other fatigue: Principal | ICD-10-CM

## 2023-07-30 DIAGNOSIS — R4182 Altered mental status, unspecified: Secondary | ICD-10-CM | POA: Diagnosis present

## 2023-07-30 LAB — COMPREHENSIVE METABOLIC PANEL
ALT: 15 U/L (ref 0–44)
AST: 24 U/L (ref 15–41)
Albumin: 3.7 g/dL (ref 3.5–5.0)
Alkaline Phosphatase: 56 U/L (ref 38–126)
Anion gap: 9 (ref 5–15)
BUN: 12 mg/dL (ref 8–23)
CO2: 27 mmol/L (ref 22–32)
Calcium: 9.1 mg/dL (ref 8.9–10.3)
Chloride: 105 mmol/L (ref 98–111)
Creatinine, Ser: 1.08 mg/dL — ABNORMAL HIGH (ref 0.44–1.00)
GFR, Estimated: 57 mL/min — ABNORMAL LOW (ref >60)
Glucose, Bld: 108 mg/dL — ABNORMAL HIGH (ref 70–99)
Potassium: 3.7 mmol/L (ref 3.5–5.1)
Sodium: 141 mmol/L (ref 135–145)
Total Bilirubin: 0.3 mg/dL (ref 0.0–1.2)
Total Protein: 6.2 g/dL — ABNORMAL LOW (ref 6.5–8.1)

## 2023-07-30 LAB — CBC WITH DIFFERENTIAL/PLATELET
Abs Immature Granulocytes: 0.02 10*3/uL (ref 0.00–0.07)
Basophils Absolute: 0.1 10*3/uL (ref 0.0–0.1)
Basophils Relative: 1 %
Eosinophils Absolute: 0.1 10*3/uL (ref 0.0–0.5)
Eosinophils Relative: 2 %
HCT: 36.6 % (ref 36.0–46.0)
Hemoglobin: 11.6 g/dL — ABNORMAL LOW (ref 12.0–15.0)
Immature Granulocytes: 0 %
Lymphocytes Relative: 37 %
Lymphs Abs: 2.4 10*3/uL (ref 0.7–4.0)
MCH: 29.9 pg (ref 26.0–34.0)
MCHC: 31.7 g/dL (ref 30.0–36.0)
MCV: 94.3 fL (ref 80.0–100.0)
Monocytes Absolute: 0.8 10*3/uL (ref 0.1–1.0)
Monocytes Relative: 12 %
Neutro Abs: 3.2 10*3/uL (ref 1.7–7.7)
Neutrophils Relative %: 48 %
Platelets: 270 10*3/uL (ref 150–400)
RBC: 3.88 MIL/uL (ref 3.87–5.11)
RDW: 14.3 % (ref 11.5–15.5)
WBC: 6.5 10*3/uL (ref 4.0–10.5)
nRBC: 0 % (ref 0.0–0.2)

## 2023-07-30 LAB — URINALYSIS, ROUTINE W REFLEX MICROSCOPIC
Bilirubin Urine: NEGATIVE
Glucose, UA: NEGATIVE mg/dL
Hgb urine dipstick: NEGATIVE
Ketones, ur: NEGATIVE mg/dL
Nitrite: NEGATIVE
Protein, ur: NEGATIVE mg/dL
Specific Gravity, Urine: 1.006 (ref 1.005–1.030)
pH: 7 (ref 5.0–8.0)

## 2023-07-30 LAB — I-STAT VENOUS BLOOD GAS, ED
Acid-Base Excess: 4 mmol/L — ABNORMAL HIGH (ref 0.0–2.0)
Bicarbonate: 29.9 mmol/L — ABNORMAL HIGH (ref 20.0–28.0)
Calcium, Ion: 1.21 mmol/L (ref 1.15–1.40)
HCT: 34 % — ABNORMAL LOW (ref 36.0–46.0)
Hemoglobin: 11.6 g/dL — ABNORMAL LOW (ref 12.0–15.0)
O2 Saturation: 96 %
Potassium: 3.7 mmol/L (ref 3.5–5.1)
Sodium: 144 mmol/L (ref 135–145)
TCO2: 31 mmol/L (ref 22–32)
pCO2, Ven: 52 mm[Hg] (ref 44–60)
pH, Ven: 7.367 (ref 7.25–7.43)
pO2, Ven: 89 mm[Hg] — ABNORMAL HIGH (ref 32–45)

## 2023-07-30 LAB — GLUCOSE, CAPILLARY
Glucose-Capillary: 103 mg/dL — ABNORMAL HIGH (ref 70–99)
Glucose-Capillary: 118 mg/dL — ABNORMAL HIGH (ref 70–99)

## 2023-07-30 LAB — RAPID URINE DRUG SCREEN, HOSP PERFORMED
Amphetamines: NOT DETECTED
Barbiturates: NOT DETECTED
Benzodiazepines: NOT DETECTED
Cocaine: POSITIVE — AB
Opiates: NOT DETECTED
Tetrahydrocannabinol: NOT DETECTED

## 2023-07-30 LAB — TSH: TSH: 0.435 u[IU]/mL (ref 0.350–4.500)

## 2023-07-30 LAB — MRSA NEXT GEN BY PCR, NASAL: MRSA by PCR Next Gen: NOT DETECTED

## 2023-07-30 LAB — CBG MONITORING, ED: Glucose-Capillary: 96 mg/dL (ref 70–99)

## 2023-07-30 LAB — SALICYLATE LEVEL: Salicylate Lvl: <7 mg/dL — ABNORMAL LOW (ref 7.0–30.0)

## 2023-07-30 LAB — ETHANOL: Alcohol, Ethyl (B): <10 mg/dL (ref <10)

## 2023-07-30 LAB — ACETAMINOPHEN LEVEL: Acetaminophen (Tylenol), Serum: <10 ug/mL — ABNORMAL LOW (ref 10–30)

## 2023-07-30 LAB — T4, FREE: Free T4: 0.9 ng/dL (ref 0.61–1.12)

## 2023-07-30 MED ORDER — LORAZEPAM 2 MG/ML IJ SOLN: 2.0000 mg | Freq: Once | INTRAMUSCULAR | Status: DC

## 2023-07-30 MED ORDER — POLYETHYLENE GLYCOL 3350 17 G PO PACK
17.0000 g | PACK | Freq: Every day | ORAL | Status: DC | PRN
Start: 2023-07-30 — End: 2023-07-31

## 2023-07-30 MED ORDER — NALOXONE HCL 2 MG/2ML IJ SOSY
PREFILLED_SYRINGE | INTRAMUSCULAR | Status: AC
  Administered 2023-07-30: 2 mg via INTRAVENOUS
  Filled 2023-07-30: qty 2

## 2023-07-30 MED ORDER — LEVOTHYROXINE SODIUM 25 MCG PO TABS: 125.0000 ug | ORAL_TABLET | Freq: Every day | ORAL | Status: DC

## 2023-07-30 MED ORDER — NALOXONE HCL 2 MG/2ML IJ SOSY: 2.0000 mg | PREFILLED_SYRINGE | Freq: Once | INTRAMUSCULAR | Status: AC

## 2023-07-30 MED ORDER — SIMVASTATIN 20 MG PO TABS
40.0000 mg | ORAL_TABLET | Freq: Every day | ORAL | Status: DC
  Filled 2023-07-30: qty 2

## 2023-07-30 MED ORDER — NALOXONE HCL 2 MG/2ML IJ SOSY
1.0000 mg | PREFILLED_SYRINGE | Freq: Once | INTRAMUSCULAR | Status: AC
  Administered 2023-07-30: 1 mg via INTRAVENOUS
  Filled 2023-07-30: qty 2

## 2023-07-30 MED ORDER — LACTATED RINGERS IV SOLN: INTRAVENOUS | Status: DC

## 2023-07-30 MED ORDER — HEPARIN SODIUM (PORCINE) 5000 UNIT/ML IJ SOLN
5000.0000 [IU] | Freq: Three times a day (TID) | INTRAMUSCULAR | Status: DC
  Administered 2023-07-30 – 2023-07-31 (×2): 5000 [IU] via SUBCUTANEOUS
  Filled 2023-07-30 (×2): qty 1

## 2023-07-30 MED ORDER — NALOXONE HCL 0.4 MG/ML IJ SOLN
0.4000 mg | Freq: Once | INTRAMUSCULAR | Status: AC
  Administered 2023-07-30: 0.4 mg via INTRAVENOUS
  Filled 2023-07-30: qty 1

## 2023-07-30 MED ORDER — NALOXONE HCL 0.4 MG/ML IJ SOLN: 0.4000 mg | Freq: Once | INTRAMUSCULAR | Status: AC

## 2023-07-30 MED ORDER — NALOXONE HCL 0.4 MG/ML IJ SOLN
0.4000 mg | Freq: Once | INTRAMUSCULAR | Status: AC
Start: 2023-07-30 — End: 2023-07-30
  Administered 2023-07-30: 0.4 mg via INTRAVENOUS
  Filled 2023-07-30: qty 1

## 2023-07-30 MED ORDER — HYDRALAZINE HCL 20 MG/ML IJ SOLN: 10.0000 mg | INTRAMUSCULAR | Status: DC | PRN

## 2023-07-30 MED ORDER — CHLORHEXIDINE GLUCONATE CLOTH 2 % EX PADS
6.0000 | MEDICATED_PAD | Freq: Every day | CUTANEOUS | Status: DC
  Administered 2023-07-30: 6 via TOPICAL

## 2023-07-30 MED ORDER — NALOXONE HCL 0.4 MG/ML IJ SOLN
INTRAMUSCULAR | Status: AC
  Administered 2023-07-30: 0.4 mg via INTRAVENOUS
  Filled 2023-07-30: qty 1

## 2023-07-30 MED ORDER — ATROPINE SULFATE 1 MG/10ML IJ SOSY
PREFILLED_SYRINGE | INTRAMUSCULAR | Status: AC
  Filled 2023-07-30: qty 10

## 2023-07-30 MED ORDER — DOCUSATE SODIUM 100 MG PO CAPS: 100.0000 mg | ORAL_CAPSULE | Freq: Two times a day (BID) | ORAL | Status: DC | PRN

## 2023-07-30 MED ORDER — HYDRALAZINE HCL 20 MG/ML IJ SOLN: 5.0000 mg | INTRAMUSCULAR | Status: DC | PRN

## 2023-07-30 MED ORDER — ALBUTEROL SULFATE (2.5 MG/3ML) 0.083% IN NEBU: 2.5000 mg | INHALATION_SOLUTION | RESPIRATORY_TRACT | Status: DC | PRN

## 2023-07-30 MED ORDER — ENOXAPARIN SODIUM 40 MG/0.4ML IJ SOSY: 40.0000 mg | PREFILLED_SYRINGE | INTRAMUSCULAR | Status: DC

## 2023-07-30 MED ORDER — ORAL CARE MOUTH RINSE: 15.0000 mL | OROMUCOSAL | Status: DC | PRN

## 2023-07-30 MED ORDER — DOPAMINE-DEXTROSE 3.2-5 MG/ML-% IV SOLN
5.0000 ug/kg/min | INTRAVENOUS | Status: DC
  Administered 2023-07-31: 5 ug/kg/min via INTRAVENOUS
  Filled 2023-07-30: qty 250

## 2023-07-30 MED ORDER — DEXTROSE 5 % IV SOLN
1.0000 mg/h | INTRAVENOUS | Status: DC
  Administered 2023-07-30: 0.5 mg/h via INTRAVENOUS
  Administered 2023-07-30 – 2023-07-31 (×2): 1 mg/h via INTRAVENOUS
  Filled 2023-07-30 (×6): qty 10

## 2023-07-30 NOTE — ED Provider Notes (Signed)
 [facility] Provider Note   CSN: [id number] Arrival date & time: 07/30/23  1233     History  Chief Complaint  Patient presents with   Altered Mental Status    Tina Bartlett is a 68 y.o. female.  Patient is a 68 year old female with PMH neuropathy and back pain on baclofen  and gabapentin  and hypothyroidism presenting to the ER with AMS. Per EMS the patient was at [facility] today and during her visit became increasingly lethargic and 911 was called. They report she had a UDS there positive for cocaine  and friend reports she is on baclofen  and gabapentin  but unclear if she took extra doses or any other medications or substances. Further history is limited due to patient's mental status.  The history is provided by the EMS personnel.  Altered Mental Status      Home Medications Prior to Admission medications   Medication Sig Start Date End Date Taking? Authorizing Provider  albuterol  (VENTOLIN  HFA) 108 (90 Base) MCG/ACT inhaler Inhale 2 puffs into the lungs See admin instructions. Every 4-6 hours as needed 07/21/23   [provider]  baclofen  (LIORESAL ) 20 MG tablet Take 20 mg by mouth 2 (two) times daily.    [provider]  gabapentin  (NEURONTIN ) 600 MG tablet Take 1 tablet (600 mg total) by mouth 3 (three) times daily. 04/22/22   Tanda Bleacher, MD  ibuprofen  (ADVIL ) 800 MG tablet TAKE 1 TABLET BY MOUTH EVERY 8 HOURS AS NEEDED 06/24/23   Magnant, Carlin CROME, PA-C  levothyroxine  (SYNTHROID ) 125 MCG tablet Take 125 mcg by mouth every morning. 07/10/23   [provider]  lubiprostone  (AMITIZA ) 24 MCG capsule Take 24 mcg by mouth daily.    [provider]  Multiple Vitamins-Minerals (MULTIVITAMIN WITH IRON-MINERALS) liquid See admin instructions.    [provider]  pantoprazole  (PROTONIX ) 40 MG tablet Take 40 mg by mouth daily. 06/24/23   [provider]  simvastatin  (ZOCOR ) 40 MG  tablet Take 1 tablet (40 mg total) by mouth daily. 04/22/22   Tanda Bleacher, MD  traMADol  (ULTRAM ) 50 MG tablet Take 1 tablet (50 mg total) by mouth at bedtime as needed. 07/29/23 07/28/24  Magnant, Carlin CROME, PA-C      Allergies    Hydroxyzine , Nsaids, Tramadol , and Tylenol  [acetaminophen ]    Review of Systems   Review of Systems  Physical Exam Updated Vital Signs BP (!) 98/58 (BP Location: Right Arm)   Pulse (!) 48   Temp 97.8 F (36.6 C) (Axillary)   Resp 12   SpO2 100%  Physical Exam Vitals and nursing note reviewed.  Constitutional:      Appearance: She is not diaphoretic.     Comments: Drowsy, spontaneously moving arms and legs but otherwise not arousable to noxious stimuli or following commands  HENT:     Head: Normocephalic and atraumatic.     Nose: Nose normal.     Mouth/Throat:     Mouth: Mucous membranes are moist.     Pharynx: Oropharynx is clear.  Eyes:     Comments: Pupils 2mm and equal bilaterally  Cardiovascular:     Rate and Rhythm: Regular rhythm. Bradycardia present.     Heart sounds: Normal heart sounds.  Pulmonary:     Effort: Pulmonary effort is normal.     Breath sounds: Normal breath sounds.  Abdominal:     General: Abdomen is flat.     Palpations: Abdomen is soft.  Tenderness: There is no abdominal tenderness.  Musculoskeletal:        General: Normal range of motion.     Cervical back: Normal range of motion.  Skin:    General: Skin is warm and dry.  Neurological:     Comments: Drowsy, eyes closed, moving all 4 extremities spontaneously but not following commands     ED Results / Procedures / Treatments   Labs (all labs ordered are listed, but only abnormal results are displayed) Labs Reviewed  COMPREHENSIVE METABOLIC PANEL - Abnormal; Notable for the following components:      Result Value   Glucose, Bld 108 (*)    Creatinine, Ser 1.08 (*)    Total Protein 6.2 (*)    GFR, Estimated 57 (*)    All other components within normal  limits  CBC WITH DIFFERENTIAL/PLATELET - Abnormal; Notable for the following components:   Hemoglobin 11.6 (*)    All other components within normal limits  ACETAMINOPHEN  LEVEL - Abnormal; Notable for the following components:   Acetaminophen  (Tylenol ), Serum <10 (*)    All other components within normal limits  SALICYLATE LEVEL - Abnormal; Notable for the following components:   Salicylate Lvl <7.0 (*)    All other components within normal limits  I-STAT VENOUS BLOOD GAS, ED - Abnormal; Notable for the following components:   pO2, Ven 89 (*)    Bicarbonate 29.9 (*)    Acid-Base Excess 4.0 (*)    HCT 34.0 (*)    Hemoglobin 11.6 (*)    All other components within normal limits  ETHANOL  URINALYSIS, ROUTINE W REFLEX MICROSCOPIC  RAPID URINE DRUG SCREEN, HOSP PERFORMED  AMMONIA  TSH  T4, FREE  CBG MONITORING, ED    EKG EKG Interpretation Date/Time:  Wednesday July 30 2023 12:47:27 EST Ventricular Rate:  41 PR Interval:  131 QRS Duration:  89 QT Interval:  515 QTC Calculation: 426 R Axis:   76  Text Interpretation: Sinus bradycardia Anteroseptal infarct, age indeterminate No previous ECGs available Confirmed by Ellouise Fine ([id number]) on 07/30/2023 12:52:42 PM  Radiology CT HEAD WO CONTRAST Result Date: 07/30/2023 CLINICAL DATA:  Mental status change of unknown cause EXAM: CT HEAD WITHOUT CONTRAST TECHNIQUE: Contiguous axial images were obtained from the base of the skull through the vertex without intravenous contrast. RADIATION DOSE REDUCTION: This exam was performed according to the departmental dose-optimization program which includes automated exposure control, adjustment of the mA and/or kV according to patient size and/or use of iterative reconstruction technique. COMPARISON:  None Available. FINDINGS: Brain: The brain shows a normal appearance without evidence of malformation, atrophy, old or acute small or large vessel infarction, mass lesion, hemorrhage, hydrocephalus  or extra-axial collection. Vascular: No hyperdense vessel. No evidence of atherosclerotic calcification. Skull: Normal.  No traumatic finding.  No focal bone lesion. Sinuses/Orbits: Sinuses are clear. Orbits appear normal. Mastoids are clear. Other: None significant IMPRESSION: Normal head CT. Electronically Signed   By: Oneil Officer M.D.   On: 07/30/2023 14:39   DG Chest Port 1 View Result Date: 07/30/2023 CLINICAL DATA:  Altered mental status. EXAM: PORTABLE CHEST 1 VIEW COMPARISON:  07/23/2022. FINDINGS: Bilateral lung fields are clear. Bilateral costophrenic angles are clear. Normal cardio-mediastinal silhouette. No acute osseous abnormalities. Lower cervical spinal fixation hardware noted. The soft tissues are within normal limits. There are surgical staples along the left lower neck. IMPRESSION: No active disease. Electronically Signed   By: Ree Molt M.D.   On: 07/30/2023 14:16  Procedures Procedures    Medications Ordered in ED Medications  naloxone  (NARCAN ) injection 0.4 mg (0.4 mg Intravenous Given 07/30/23 1325)    ED Course/ Medical Decision Making/ A&P Clinical Course as of 07/30/23 1552  Wed Jul 30, 2023  1341 No change in mental status with Narcan . [VK]  1516 Labs without significant abnormality to explain her symptoms. Urine is pending. RN unable to perform in and out cath for urine as patient became agitated with IV placement and do not want to give sedation in the setting of her AMS. Patient will require admission for further AMS work up. [VK]  1520 Assumed care. 68 yo F hypothyroidism and chronic back pain who presented with somnolence and lethargy. Was at PCP office and became more lethargic. On baclofen  and gabapentin  for chronic back pain. UDS at [facility] [address] was cocaine  positive. Will move her arms and legs but isn't moving. Tried to fight nurse when she attempted to put in an IV.  [RP]    Clinical Course User Index [RP] Yolande Lamar BROCKS, MD [VK] Kingsley, Josiane Labine  K, DO                                 Medical Decision Making This patient presents to the ED with chief complaint(s) of AMS with pertinent past medical history of back pain, neuropathy, hypothyroidism which further complicates the presenting complaint. The complaint involves an extensive differential diagnosis and also carries with it a high risk of complications and morbidity.    The differential diagnosis includes syncope, arrhythmia, anemia, dehydration, electrolyte abnormality, intoxication, ICH/mass effect, hypo or hyperglycemia, overdose  Additional history obtained: Additional history obtained from EMS  Records reviewed outpatient orthopedics records - recently given refill of tramadol   ED Course and Reassessment: On patient's arrival, she is hemodynamically stable but drowsy and not following commands. Accucheck on arrival within normal range. She does have small pupils and will trial Narcan . Will have EKG, Labs and CTH to further evaluate cause of AMS and will closely reassess.  Independent labs interpretation:  The following labs were independently interpreted: within normal range  Independent visualization of imaging: - I independently visualized the following imaging with scope of interpretation limited to determining acute life threatening conditions related to emergency care: CXR, CTH, which revealed no acute disease  Consultation: - Consulted or discussed management/test interpretation w/ external professional: hospitalist  Consideration for admission or further workup: patient requires admission for further work up of AMS Social Determinants of health: N/A    Amount and/or Complexity of Data Reviewed Labs: ordered. Radiology: ordered.  Risk Prescription drug management. Decision regarding hospitalization.          Final Clinical Impression(s) / ED Diagnoses Final diagnoses:  Lethargy    Rx / DC Orders ED Discharge Orders     None          Kingsley, Konstance Happel K, DO 07/30/23 1552

## 2023-07-30 NOTE — H&P (Signed)
 NAMECharita Bartlett, MRN:  [id number], DOB:  1955/09/17, LOS: 0 ADMISSION DATE:  07/30/2023, CONSULTATION DATE:  07/30/23 REFERRING MD:  Elgergawy CHIEF COMPLAINT:  AMS   History of Present Illness:  Tina Bartlett is a 68 y.o. female who has PMH as below. She presented to [facility] from outside urgent care type facility 07/30/23 with AMS. She was at [facility] today and became more altered so 911 was called. She had UDS there that was apparently positive for cocaine  and she reported last used 07/29/23. Friend also informed staff there that she takes baclofen , gabapentin , tramadol  but unclear when she last took those or how much she might have taken.   She came to ED where she remained altered. She received 2.8mg  total Narcan  with some improvement though would drift back to sleep. CT head was negative  PCCM subsequently called to see for ICU transfer/admission and consideration narcan  infusion.  Pertinent  Medical History:  has Hypothyroidism; Hyperlipidemia; Vasomotor symptoms due to menopause; Cervical pseudoarthrosis (HCC); History of lumbar laminectomy for spinal cord decompression; Daytime somnolence; Dream enactment behavior; Obstructive sleep apnea syndrome; Acquired hypothyroidism; Acute encephalopathy; and Overdose on their problem list.  Significant Hospital Events: Including procedures, antibiotic start and stop dates in addition to other pertinent events   1/8 admit.  Interim History / Subjective:  Opens eyes and follows basic commands after 2.8mg  Narcan  but does drift back asleep fast. Says a pill when asked what she took.  Objective:  Blood pressure 111/74, pulse (!) 41, temperature (!) 97.5 F (36.4 C), temperature source Oral, resp. rate 12, SpO2 100%.       No intake or output data in the 24 hours ending 07/30/23 1815 There were no vitals filed for this visit.  Examination: General: Adult female, resting in bed, in NAD. Neuro: Somnolent, opens eyes and follows basic  commands but drifts back to sleep quickly.Tina Bartlett HEENT: [place]/AT. Sclerae anicteric. Pupils dilated. MM dry.. Cardiovascular: brady, regular, no M/R/G.  Lungs: Respirations even and unlabored.  CTA bilaterally, No W/R/R. Abdomen: BS x 4, soft, NT/ND.  Musculoskeletal: No gross deformities, no edema.  Skin: Intact, warm, no rashes.   Labs/imaging personally reviewed:  CT head 1/8 > neg.  Assessment & Plan:   Acute metabolic encephalopathy - presumed 2/2 overdose on possibly baclofen , gabapentin , tramadol . Also some concern for cocaine  laced with fentanyl or opioid use given response to Narcan  (last use of cocaine  1/7). TSH normal. - Admit to ICU for tonight with plans for transfer out in AM 1/9. - Start narcan  drip for a few hours and wean off as she awakens more. - Supportive care. - F/u on ammonia level. - Substance abuse counseling. - Hold PTA Baclofen , Gabapentin , Tramadol . - F/u on UDS and serum drug panel sent already.  HTN - exacerbated by cocaine  use. - Hydralazine  PRN for SBP > 170. - Substance abuse counseling.  Hx hypothyroidism. - Continue PTA Synthroid .   Admit to ICU overnight. Wean off narcan  gtt over next few hours. TRH to pickup in AM 1/9 unless can't get of Narcan . Discussed with Dr. Sherlon.  Best practice (evaluated daily):  Diet/type: NPO - advance once more awake DVT prophylaxis: prophylactic heparin   Pressure ulcer(s): pressure ulcer assessment deferred  GI prophylaxis: N/A Lines: N/A Foley:  N/A Code Status:  full code Last date of multidisciplinary goals of care discussion: None yet.  Labs   CBC: Recent Labs  Lab 07/30/23 1300 07/30/23 1548  WBC 6.5  --   NEUTROABS  3.2  --   HGB 11.6* 11.6*  HCT 36.6 34.0*  MCV 94.3  --   PLT 270  --     Basic Metabolic Panel: Recent Labs  Lab 07/30/23 1300 07/30/23 1548  NA 141 144  K 3.7 3.7  CL 105  --   CO2 27  --   GLUCOSE 108*  --   BUN 12  --   CREATININE 1.08*  --   CALCIUM 9.1  --     GFR: CrCl cannot be calculated (Unknown ideal weight.). Recent Labs  Lab 07/30/23 1300  WBC 6.5    Liver Function Tests: Recent Labs  Lab 07/30/23 1300  AST 24  ALT 15  ALKPHOS 56  BILITOT 0.3  PROT 6.2*  ALBUMIN 3.7   No results for input(s): LIPASE, AMYLASE in the last 168 hours. No results for input(s): AMMONIA in the last 168 hours.  ABG    Component Value Date/Time   HCO3 29.9 (H) 07/30/2023 1548   TCO2 31 07/30/2023 1548   O2SAT 96 07/30/2023 1548     Coagulation Profile: No results for input(s): INR, PROTIME in the last 168 hours.  Cardiac Enzymes: No results for input(s): CKTOTAL, CKMB, CKMBINDEX, TROPONINI in the last 168 hours.  HbA1C: Hgb A1c MFr Bld  Date/Time Value Ref Range Status  04/22/2022 09:43 AM 5.9 (H) 4.8 - 5.6 % Final    Comment:             Prediabetes: 5.7 - 6.4          Diabetes: >6.4          Glycemic control for adults with diabetes: <7.0     CBG: Recent Labs  Lab 07/30/23 1302  GLUCAP 96    Review of Systems:   Unable to obtain as pt is encephalopathic.  Past Medical History:  She,  has a past medical history of Thyroid  disease.   Surgical History:   Past Surgical History:  Procedure Laterality Date   APPENDECTOMY     68 years old   CERVICAL FUSION  2020   C5-6 ACDF   JOINT REPLACEMENT Bilateral    Hips   LUMBAR SPINE SURGERY  2020   [facility] [place]   THYROIDECTOMY       Social History:   reports that she has been smoking cigarettes. She has a 19 pack-year smoking history. She has never used smokeless tobacco. She reports that she does not currently use drugs.   Family History:  Her family history includes Lung cancer in her mother.   Allergies Allergies  Allergen Reactions   Hydroxyzine  Other (See Comments)    Hallucinations    Nsaids Other (See Comments)    Pt reports causes tremors and jitters   Tramadol  Nausea Only and Other (See Comments)    Causes  drowsiness    Tylenol  [Acetaminophen ] Hives and Rash     Home Medications  Prior to Admission medications   Medication Sig Start Date End Date Taking? Authorizing Provider  albuterol  (VENTOLIN  HFA) 108 (90 Base) MCG/ACT inhaler Inhale 2 puffs into the lungs See admin instructions. Every 4-6 hours as needed 07/21/23   [provider]  baclofen  (LIORESAL ) 20 MG tablet Take 20 mg by mouth 2 (two) times daily.    [provider]  gabapentin  (NEURONTIN ) 600 MG tablet Take 1 tablet (600 mg total) by mouth 3 (three) times daily. 04/22/22   Tanda Bleacher, MD  ibuprofen  (ADVIL ) 800 MG tablet TAKE 1  TABLET BY MOUTH EVERY 8 HOURS AS NEEDED 06/24/23   Magnant, Carlin CROME, PA-C  levothyroxine  (SYNTHROID ) 125 MCG tablet Take 125 mcg by mouth every morning. 07/10/23   [provider]  lubiprostone  (AMITIZA ) 24 MCG capsule Take 24 mcg by mouth daily.    [provider]  Multiple Vitamins-Minerals (MULTIVITAMIN WITH IRON-MINERALS) liquid See admin instructions.    [provider]  pantoprazole  (PROTONIX ) 40 MG tablet Take 40 mg by mouth daily. 06/24/23   [provider]  simvastatin  (ZOCOR ) 40 MG tablet Take 1 tablet (40 mg total) by mouth daily. 04/22/22   Tanda Bleacher, MD  traMADol  (ULTRAM ) 50 MG tablet Take 1 tablet (50 mg total) by mouth at bedtime as needed. 07/29/23 07/28/24  Magnant, Carlin CROME, PA-C     Critical care time: 30 min.   Sammi Gore, PA - C [facility] For pager details, please see AMION or use Epic chat  After 1900, please call [facility] for cross coverage needs 07/30/2023, 6:15 PM

## 2023-07-30 NOTE — Plan of Care (Signed)
  Problem: Education: Goal: Knowledge of General Education information will improve Description: Including pain rating scale, medication(s)/side effects and non-pharmacologic comfort measures Outcome: Progressing   Problem: Health Behavior/Discharge Planning: Goal: Ability to manage health-related needs will improve Outcome: Progressing   Problem: Clinical Measurements: Goal: Ability to maintain clinical measurements within normal limits will improve Outcome: Progressing Goal: Will remain free from infection Outcome: Progressing Goal: Diagnostic test results will improve Outcome: Progressing Goal: Respiratory complications will improve Outcome: Progressing Goal: Cardiovascular complication will be avoided Outcome: Progressing   Problem: Activity: Goal: Risk for activity intolerance will decrease Outcome: Progressing   Problem: Coping: Goal: Level of anxiety will decrease Outcome: Progressing   Problem: Elimination: Goal: Will not experience complications related to bowel motility Outcome: Progressing Goal: Will not experience complications related to urinary retention Outcome: Progressing   Problem: Pain Management: Goal: General experience of comfort will improve Outcome: Progressing   Problem: Safety: Goal: Ability to remain free from injury will improve Outcome: Progressing   Problem: Skin Integrity: Goal: Risk for impaired skin integrity will decrease Outcome: Progressing   Problem: Nutrition: Goal: Adequate nutrition will be maintained Outcome: Not Progressing   Problem: Safety: Goal: Non-violent Restraint(s) Outcome: Not Applicable

## 2023-07-30 NOTE — ED Provider Notes (Signed)
  Physical Exam  BP (!) 144/79   Pulse (!) 44   Temp 98.2 F (36.8 C) (Oral)   Resp 14   Wt 52.6 kg   SpO2 98%   BMI 23.42 kg/m   Physical Exam  Procedures  Procedures  ED Course / MDM   Clinical Course as of 08/01/23 1408  Wed Jul 30, 2023  1341 No change in mental status with Narcan . [VK]  1516 Labs without significant abnormality to explain her symptoms. Urine is pending. RN unable to perform in and out cath for urine as patient became agitated with IV placement and do not want to give sedation in the setting of her AMS. Patient will require admission for further AMS work up. [VK]  1520 Assumed care. 68 yo F hypothyroidism and chronic back pain who presented with somnolence and lethargy. Was at PCP office and became more lethargic. On baclofen  and gabapentin  for chronic back pain. UDS at [facility] [address] was cocaine  positive. Will move her arms and legs but isn't moving. Tried to fight nurse when she attempted to put in an IV.  [RP]  1725 Dr Sheilah to evaluate for admission. Suspect the patient is altered from polysubstance use.  [RP]    Clinical Course User Index [RP] Yolande Lamar BROCKS, MD [VK] Kingsley, Victoria K, DO   Medical Decision Making Amount and/or Complexity of Data Reviewed Labs: ordered. Radiology: ordered.  Risk Prescription drug management. Decision regarding hospitalization.      Yolande Lamar BROCKS, MD 08/01/23 [phone]

## 2023-07-30 NOTE — Progress Notes (Addendum)
 Patient eLink Physician-Brief Progress Note Patient Name: Tina Bartlett DOB: [birth year] MRN: [id number]   Date of Service  07/30/2023  HPI/Events of Note  69 year old female with history of cocaine  use disorder, daytime somnolence, obstructive sleep apnea who presented with acute encephalopathy thought to be secondary to combined baclofen , gabapentin , and tramadol  overdose.  Potential fentanyl exposure.  Notified that the patient is becoming apneic for several seconds at a time.  Vital signs, labs, and imaging reviewed.  eICU Interventions  Increase Narcan  infusion to 1 Mg per hour  Pushed additional 0.4 mg of Narcan  in the room-not a substantial response.  Does have periods of apnea although they seem to be short.  Responds to stimulation verbal and physical.    Placed on end-tidal CO2 monitoring with a goal ETCO2 less than 50.  If it increases beyond that, we will initiate AVAPS.  GI prophylaxis not indicated DVT prophylaxis with heparin  subcutaneous    2057 -concern with end-tidal CO2 is highly variable.  During a period without any stimulation, she is observed to have a consistent end-tidal CO2 in the 40s.  Even in the setting of brief periods of apnea, she stabilizes back to the 40s.  No intervention is indicated at this time.  2343 -heart rate downtrending now reaching in the low 30s.  Still appears to be sinus bradycardia.  Blood pressure and high, but will start low-dose dopamine  continuous infusion.    Intervention Category Intermediate Interventions: Respiratory distress - evaluation and management  Hayzen Lorenson 07/30/2023, 7:59 PM

## 2023-07-30 NOTE — ED Triage Notes (Signed)
 PT BIB EMS from [facility], patient walked out to her ride and seemed okay, while at the facility patient became more altered, and only oriented to self.  States her feet hurt, takes Gabapentin  and Baclofen , did test positive for cocaine , responds to pain.   128/60 Pupil dilated possibly 4-5 and sluggish HR 40's Resp 16 CBG 126

## 2023-07-31 DIAGNOSIS — G9340 Encephalopathy, unspecified: Secondary | ICD-10-CM | POA: Diagnosis not present

## 2023-07-31 LAB — HIV ANTIBODY (ROUTINE TESTING W REFLEX): HIV Screen 4th Generation wRfx: NONREACTIVE

## 2023-07-31 LAB — BASIC METABOLIC PANEL
Anion gap: 11 (ref 5–15)
BUN: 8 mg/dL (ref 8–23)
CO2: 23 mmol/L (ref 22–32)
Calcium: 9.2 mg/dL (ref 8.9–10.3)
Chloride: 106 mmol/L (ref 98–111)
Creatinine, Ser: 0.74 mg/dL (ref 0.44–1.00)
GFR, Estimated: >60 mL/min (ref >60)
Glucose, Bld: 135 mg/dL — ABNORMAL HIGH (ref 70–99)
Potassium: 4 mmol/L (ref 3.5–5.1)
Sodium: 140 mmol/L (ref 135–145)

## 2023-07-31 LAB — AMMONIA: Ammonia: <10 umol/L (ref 9–35)

## 2023-07-31 LAB — CBC
HCT: 41.1 % (ref 36.0–46.0)
Hemoglobin: 12.9 g/dL (ref 12.0–15.0)
MCH: 29.5 pg (ref 26.0–34.0)
MCHC: 31.4 g/dL (ref 30.0–36.0)
MCV: 94.1 fL (ref 80.0–100.0)
Platelets: 226 10*3/uL (ref 150–400)
RBC: 4.37 MIL/uL (ref 3.87–5.11)
RDW: 14.2 % (ref 11.5–15.5)
WBC: 3.8 10*3/uL — ABNORMAL LOW (ref 4.0–10.5)
nRBC: 0 % (ref 0.0–0.2)

## 2023-07-31 LAB — GLUCOSE, CAPILLARY
Glucose-Capillary: 116 mg/dL — ABNORMAL HIGH (ref 70–99)
Glucose-Capillary: 117 mg/dL — ABNORMAL HIGH (ref 70–99)

## 2023-07-31 LAB — PHOSPHORUS: Phosphorus: 4 mg/dL (ref 2.5–4.6)

## 2023-07-31 LAB — MAGNESIUM: Magnesium: 1.9 mg/dL (ref 1.7–2.4)

## 2023-07-31 MED ORDER — SIMVASTATIN 20 MG PO TABS: 40.0000 mg | ORAL_TABLET | Freq: Every day | ORAL | Status: DC

## 2023-07-31 MED ORDER — MAGNESIUM SULFATE 2 GM/50ML IV SOLN
2.0000 g | Freq: Once | INTRAVENOUS | Status: AC
  Administered 2023-07-31: 2 g via INTRAVENOUS
  Filled 2023-07-31: qty 50

## 2023-07-31 MED ORDER — LEVOTHYROXINE SODIUM 25 MCG PO TABS: 125.0000 ug | ORAL_TABLET | Freq: Every morning | ORAL | Status: DC

## 2023-07-31 MED ORDER — PANTOPRAZOLE SODIUM 40 MG PO TBEC: 40.0000 mg | DELAYED_RELEASE_TABLET | Freq: Every day | ORAL | Status: DC

## 2023-07-31 NOTE — Progress Notes (Signed)
 OT Cancellation Note  Patient Details Name: Tashauna Caisse MRN: [id number] DOB: 01-11-1956   Cancelled Treatment:    Reason Eval/Treat Not Completed: Per PT OT screened, no needs identified, will sign off  Leita JINNY Odea 07/31/2023, 12:42 PM  Leita DEL OTR/L Acute Rehabilitation Services Office: [phone]

## 2023-07-31 NOTE — Care Management CC44 (Signed)
 Condition Code 44 Documentation Completed  Patient Details  Name: Andilynn Delavega MRN: [id number] Date of Birth: 24-Jan-1956   Condition Code 44 given:   (Letter will be mailed to patient) Patient signature on Condition Code 44 notice:    Documentation of 2 MD's agreement:    Code 44 added to claim:       Tom-Johnson, Harvest Muskrat, RN 07/31/2023, 1:01 PM

## 2023-07-31 NOTE — Care Management Obs Status (Signed)
 MEDICARE OBSERVATION STATUS NOTIFICATION   Patient Details  Name: Jadah Bobak MRN: [id number] Date of Birth: 01-28-1956   Medicare Observation Status Notification Given:  Other (see comment) (Letter will be mailed to patient.)    Tom-Johnson, Tiyana Galla Daphne, RN 07/31/2023, 1:01 PM

## 2023-07-31 NOTE — Evaluation (Signed)
 Physical Therapy Brief Evaluation and Discharge Note Patient Details Name: Tina Bartlett MRN: [id number] DOB: 1955/09/25 Today's Date: 07/31/2023   History of Present Illness  68 year old female with history of cocaine  use disorder, daytime somnolence, obstructive sleep apnea who presented with acute encephalopathy thought to be secondary to combined baclofen , gabapentin , and tramadol  overdose.  Potential fentanyl exposure.  Clinical Impression  Pt admitted with above diagnosis.  Pt currently without functional limitations with plan for pt to go home today.  Husband present and agrees he will assist her PRN at home.  Issued gait belt.  D/C PT    PT Assessment Patient does not need any further PT services  Assistance Needed at Discharge  PRN    Equipment Recommendations Other (comment) (issued gait belt)  Recommendations for Other Services       Precautions/Restrictions Precautions Precautions: None Restrictions Weight Bearing Restrictions Per Provider Order: No        Mobility  Bed Mobility Rolling: Independent        Transfers Overall transfer level: Independent                      Ambulation/Gait Ambulation/Gait assistance: Independent            Home Activity Instructions    Stairs            Modified Rankin (Stroke Patients Only)        Balance Overall balance assessment: Independent                        Pertinent Vitals/Pain PT - Brief Vital Signs All Vital Signs Stable: Yes Pain Assessment Pain Assessment: No/denies pain     Home Living Family/patient expects to be discharged to:: Private residence Living Arrangements: Spouse/significant other Available Help at Discharge: Family;Available 24 hours/day Home Environment: Level entry   Home Equipment: None        Prior Function Level of Independence: Independent      UE/LE Assessment   UE ROM/Strength/Tone/Coordination: WFL    LE  ROM/Strength/Tone/Coordination: [facility]      Communication   Communication Communication: No apparent difficulties     Cognition Overall Cognitive Status: Appears within functional limits for tasks assessed/performed (Pt was somewhat agitated earlier per nurse but was mostly pleasant with PT during session.  Pt expresses desire to go home and MD and nurse working to get pt out today.  Husband present.)       General Comments      Exercises     Assessment/Plan    PT Problem List         PT Visit Diagnosis Muscle weakness (generalized) (M62.81)    No Skilled PT All education completed;Patient at baseline level of functioning;Patient is independent with all acitivity/mobility;Patient will have necessary level of assist by caregiver at discharge   Co-evaluation                AMPAC 6 Clicks Help needed turning from your back to your side while in a flat bed without using bedrails?: None Help needed moving from lying on your back to sitting on the side of a flat bed without using bedrails?: None Help needed moving to and from a bed to a chair (including a wheelchair)?: None Help needed standing up from a chair using your arms (e.g., wheelchair or bedside chair)?: None Help needed to walk in hospital room?: None Help needed climbing 3-5 steps with a railing? : None 6 Click  Score: 24      End of Session Equipment Utilized During Treatment: Gait belt Activity Tolerance: Patient tolerated treatment well Patient left: in bed;with call bell/phone within reach;with family/visitor present;with nursing/sitter in room Nurse Communication: Mobility status PT Visit Diagnosis: Muscle weakness (generalized) (M62.81)     Time: 8799-8784 PT Time Calculation (min) (ACUTE ONLY): 15 min  Charges:   PT Evaluation $PT Eval Low Complexity: 1 Low      Tina Bartlett M,PT Acute Rehab Services [phone]   Tina Bartlett  07/31/2023, 12:38 PM

## 2023-07-31 NOTE — Discharge Summary (Signed)
 Physician Discharge Summary  Patient ID: Tina Bartlett MRN: [id number] DOB/AGE: [birth year] 68 y.o.  Admit date: 07/30/2023 Discharge date: 07/31/2023  Admission Diagnoses:  Discharge Diagnoses:  Principal Problem:   Acute encephalopathy Active Problems:   Hypothyroidism   Hyperlipidemia   Obstructive sleep apnea syndrome   Acquired hypothyroidism   Overdose   Discharged Condition: stable  Hospital Course:  Patient admitted for encephalopathy in context of cocaine , baclofen , gabapentin , and tramadol  ingestion.  She responded somewhat to narcan  and was watched overnight until medications were out of her system.  She denied SI and was ambulatory on room air at time of discharge.  Counseling given to abstain from taking too many mind-altering drugs and told her to take her baclofen , tramadol , gabapentin  only as prescribed.  She did not want rehab. She went home with husband.  Significant Diagnostic Studies:  Head CT neg BMP, CBC okay  Treatments:  Supportive care and narcan   Discharge Exam: Blood pressure (!) 144/79, pulse (!) 44, temperature 98.2 F (36.8 C), temperature source Oral, resp. rate 14, weight 52.6 kg, SpO2 98%. No distress Aox3, odd affect Moves to command Lungs clear, no accessory muscle use Abd soft Ambulates fine  Disposition: Discharge disposition: 01-Home or Self Care        Allergies as of 07/31/2023       Reactions   Hydroxyzine  Other (See Comments)   Hallucinations    Nsaids Other (See Comments)   Pt reports causes tremors and jitters   Tramadol  Nausea Only, Other (See Comments)   Causes drowsiness    Tylenol  [acetaminophen ] Hives, Rash        Medication List     TAKE these medications    albuterol  108 (90 Base) MCG/ACT inhaler Commonly known as: VENTOLIN  HFA Inhale 2 puffs into the lungs See admin instructions. Every 4-6 hours as needed   baclofen  20 MG tablet Commonly known as: LIORESAL  Take 20 mg by mouth 2 (two) times  daily.   gabapentin  600 MG tablet Commonly known as: NEURONTIN  Take 1 tablet (600 mg total) by mouth 3 (three) times daily.   ibuprofen  800 MG tablet Commonly known as: ADVIL  TAKE 1 TABLET BY MOUTH EVERY 8 HOURS AS NEEDED   levothyroxine  125 MCG tablet Commonly known as: SYNTHROID  Take 125 mcg by mouth every morning.   lubiprostone  24 MCG capsule Commonly known as: AMITIZA  Take 24 mcg by mouth daily.   multivitamin with iron-minerals liquid See admin instructions.   pantoprazole  40 MG tablet Commonly known as: PROTONIX  Take 40 mg by mouth daily.   simvastatin  40 MG tablet Commonly known as: ZOCOR  Take 1 tablet (40 mg total) by mouth daily.   traMADol  50 MG tablet Commonly known as: Ultram  Take 1 tablet (50 mg total) by mouth at bedtime as needed.         Signed: Toribio JAYSON Sharps 07/31/2023, 12:45 PM

## 2023-07-31 NOTE — Progress Notes (Signed)
 07/31/2023 Turn off all drips. Re-eval at noon/ this afternoon If baseline can likely return to SNF.  Myrla Halsted MD PCCM

## 2023-07-31 NOTE — TOC Transition Note (Signed)
 Transition of Care [facility]) - Discharge Note   Patient Details  Name: Tina Bartlett MRN: [id number] Date of Birth: Nov 04, 1955  [facility]) CM/SW Contact:  Tom-Johnson, Dantrell Schertzer Daphne, RN Phone Number: 07/31/2023, 1:13 PM   Clinical Narrative:     Patient is scheduled for discharge today.  Readmission Risk Assessment done. Hospital f/u and discharge instructions on AVS. CM attempted to deliver Code 44 letter to patient and patient had left the room. Letter given to [facility] CMA to mail to patient's home. Friend/family to transport at discharge.  No further TOC needs noted.        Final next level of care: Home/Self Care Barriers to Discharge: Barriers Resolved   Patient Goals and CMS Choice Patient states their goals for this hospitalization and ongoing recovery are:: To return home CMS Medicare.gov Compare Post Acute Care list provided to:: Patient Choice offered to / list presented to : Patient      Discharge Placement                Patient to be transferred to facility by: Family/Friend      Discharge Plan and Services Additional resources added to the After Visit Summary for                  DME Arranged: N/A DME Agency: NA       HH Arranged: NA HH Agency: NA        Social Drivers of Health (SDOH) Interventions SDOH Screenings   Housing: Unknown (07/31/2023)  Alcohol Screen: Low Risk  (01/18/2022)  Depression (PHQ2-9): Low Risk  (10/07/2022)  Financial Resource Strain: Medium Risk (01/18/2022)  Physical Activity: Insufficiently Active (01/18/2022)  Social Connections: Unknown (07/31/2023)  Stress: No Stress Concern Present (01/18/2022)  Tobacco Use: High Risk (07/30/2023)     Readmission Risk Interventions    07/31/2023    1:13 PM  Readmission Risk Prevention Plan  Post Dischage Appt Complete  Medication Screening Complete  Transportation Screening Complete

## 2023-07-31 NOTE — Progress Notes (Signed)
 [facility] ADULT ICU REPLACEMENT PROTOCOL   The patient does apply for the [facility] Adult ICU Electrolyte Replacment Protocol based on the criteria listed below:   1.Exclusion criteria: TCTS, ECMO, Dialysis, and Myasthenia Gravis patients 2. Is GFR >/= 30 ml/min? Yes.    Patient's GFR today is >60 3. Is SCr </= 2? Yes.   Patient's SCr is 0.74 mg/dL 4. Did SCr increase >/= 0.5 in 24 hours? No. 5.Pt's weight >40kg  Yes.   6. Abnormal electrolyte(s): Mg = 1.9  7. Electrolytes replaced per protocol 8.  Call MD STAT for K+ </= 2.5, Phos </= 1, or Mag </= 1 Physician:  Haze, eMD  Rosina SAILOR Hajer Dwyer 07/31/2023 4:53 AM

## 2023-07-31 NOTE — Progress Notes (Signed)
 Pt did not sign code 44 paperwork when she left. Case management will mail It to her. Pt was extremely agitated screaming and shouting had to discharge pt asap. IV removed, D/C instruction handed as pt did not want to listen and wheeled pt out to main entrance and made sure she got in her car.

## 2023-08-01 ENCOUNTER — Ambulatory Visit: Payer: Medicare HMO | Admitting: Surgical

## 2023-08-08 ENCOUNTER — Encounter: Payer: Self-pay | Admitting: Surgical

## 2023-08-18 LAB — COCAINE,MS,WB/SP RFX
Benzoylecgonine: 137 ng/mL
Cocaine Confirmation: POSITIVE
Cocaine: NEGATIVE ng/mL

## 2023-08-18 LAB — DRUG SCREEN 10 W/CONF, SERUM
Amphetamines, IA: NEGATIVE ng/mL
Barbiturates, IA: NEGATIVE ug/mL
Benzodiazepines, IA: NEGATIVE ng/mL
Cocaine & Metabolite, IA: POSITIVE ng/mL — AB
Methadone, IA: NEGATIVE ng/mL
Opiates, IA: NEGATIVE ng/mL
Oxycodones, IA: NEGATIVE ng/mL
Phencyclidine, IA: NEGATIVE ng/mL
Propoxyphene, IA: NEGATIVE ng/mL
THC(Marijuana) Metabolite, IA: NEGATIVE ng/mL

## 2023-08-20 ENCOUNTER — Ambulatory Visit
Admission: RE | Admit: 2023-08-20 | Discharge: 2023-08-20 | Disposition: A | Discharge status: Home / Self Care | Payer: Medicare HMO | Source: Ambulatory Visit | Attending: Surgical

## 2023-08-20 ENCOUNTER — Ambulatory Visit
Admission: RE | Admit: 2023-08-20 | Discharge: 2023-08-20 | Disposition: A | Discharge status: Home / Self Care | Payer: Medicare HMO | Source: Ambulatory Visit | Attending: Surgical | Admitting: Surgical

## 2023-08-20 DIAGNOSIS — M25512 Pain in left shoulder: Secondary | ICD-10-CM

## 2023-08-20 MED ORDER — IOPAMIDOL (ISOVUE-M 200) INJECTION 41%
10.0000 mL | Freq: Once | INTRAMUSCULAR | Status: AC
  Administered 2023-08-20: 10 mL via INTRA_ARTICULAR

## 2023-08-27 ENCOUNTER — Ambulatory Visit: Payer: Medicare HMO | Admitting: Orthopedic Surgery

## 2023-08-27 DIAGNOSIS — M5412 Radiculopathy, cervical region: Secondary | ICD-10-CM | POA: Diagnosis not present

## 2023-08-28 ENCOUNTER — Encounter: Payer: Self-pay | Admitting: Orthopedic Surgery

## 2023-08-28 NOTE — Progress Notes (Signed)
 Office Visit Note   Patient: Tina Bartlett           Date of Birth: [birth year]           MRN: [id number] Visit Date: 08/27/2023 Requested by: Joshua Harlene CROME, NP [address] [place],  [place] [address] PCP: Joshua Harlene CROME, NP  Subjective: Chief Complaint  Patient presents with   Left Shoulder - Pain   Neck - Pain    HPI: Lanier Felty is a 68 y.o. female who presents to the office reporting left shoulder and left arm pain.  Patient states that she is in terrible pain.  Patient states she needs some relief.  Cannot really take Ultram  or Motrin .  Does take Neurontin  and this gives her minimal relief.  Describes the pain like a turning knife.  Patient states she cannot take tramadol  baclofen  or ibuprofen .  Neck pain is new since her last clinic visit.  MRI report not available but on my review there is nothing definitively operative in that left shoulder that would explain this relatively incapacitating pain she is having.  She did have moderate left foraminal stenosis on MRI scan of the cervical spine from 2022..                ROS: All systems reviewed are negative as they relate to the chief complaint within the history of present illness.  Patient denies fevers or chills.  Assessment & Plan: Visit Diagnoses:  1. Radiculopathy, cervical region     Plan: Impression is left shoulder and arm constant pain which is not compatible with fairly underwhelming MRI findings in the shoulder.  She did have left-sided foraminal stenosis in 2022.  This is behaving a little bit like an operative problem based on the amount of pain she is having.  Need new MRI scan for evaluation of worsening of that foraminal stenosis on the left.  She may be heading for surgery.  Injections also an option but she is having a lot of pain from her description.  Follow-Up Instructions: No follow-ups on file.   Orders:  Orders Placed This Encounter  Procedures   MR Cervical Spine w/o contrast   No orders of the  defined types were placed in this encounter.     Procedures: No procedures performed   Clinical Data: No additional findings.  Objective: Vital Signs: There were no vitals taken for this visit.  Physical Exam:  Constitutional: Patient appears well-developed HEENT:  Head: Normocephalic Eyes:EOM are normal Neck: Normal range of motion Cardiovascular: Normal rate Pulmonary/chest: Effort normal Neurologic: Patient is alert Skin: Skin is warm Psychiatric: Patient has normal mood and affect  Ortho Exam: Ortho exam demonstrates pretty reasonable rotator cuff strength and range of motion on the left compared to the right.  No definite paresthesias C5-T1.  Neck range of motion is more painful this clinic visit than prior visits.  Radial pulse intact bilaterally.  No coarse grinding or crepitus with passive range of motion of the shoulder but in general she is somewhat tearful with any type of examination.  Specialty Comments:  CLINICAL DATA:  MR cervical spine eval for source of radicular arm pain   EXAM: MRI CERVICAL SPINE WITHOUT CONTRAST   TECHNIQUE: Multiplanar, multisequence MR imaging of the cervical spine was performed. No intravenous contrast was administered.   COMPARISON:  CT of the cervical spine 02/15/2021.   FINDINGS: Alignment: Straightening of the normal cervical lordosis. No substantial sagittal subluxation.   Vertebrae: Vertebral body heights are  maintained. No focal marrow edema to suggest acute fracture discitis/osteomyelitis. C5-C7 ACDF.   Cord: Normal cord signal.   Posterior Fossa, vertebral arteries, paraspinal tissues: Visualized vertebral artery flow voids are maintained. Unremarkable visualized posterior fossa. No paraspinal edema.   Disc levels:   C2-C3: No significant disc protrusion, foraminal stenosis, or canal stenosis.   C3-C4: Right paracentral disc protrusion deforms the right ventral cord with moderate canal stenosis. Left greater  than right facet and uncovertebral hypertrophy with moderate left foraminal stenosis. No significant right foraminal stenosis.   C4-C5: Posterior disc osteophyte complex with central annular fissure. Mild canal stenosis. Left greater than right facet and uncovertebral degenerative with moderate left and mild right foraminal stenosis.   C5-C6: ACDF. Right greater than left facet and uncovertebral hypertrophy. Moderate right foraminal stenosis. No significant canal or left foraminal stenosis.   C6-C7: ACDF. Slight disc bulging. Right greater than left facet and uncovertebral hypertrophy. Resulting moderate right foraminal stenosis. No significant canal or left foraminal stenosis.   C7-T1: No significant disc protrusion, foraminal stenosis, or canal stenosis.   IMPRESSION: 1. At C3-C4, right paracentral disc protrusion deforms the right ventral cord with moderate canal stenosis. Moderate left foraminal stenosis. 2. At C4-C5, mild canal stenosis with moderate left and mild right foraminal stenosis. 3. C5-C7 ACDF with moderate right foraminal stenosis at C5-C6 and C6-C7. Patent canal.     Electronically Signed   By: Gilmore GORMAN Molt M.D.   On: 05/11/2021 09:42  Imaging: No results found.   PMFS History: Patient Active Problem List   Diagnosis Date Noted   Acute encephalopathy 07/30/2023   Overdose 07/30/2023   Daytime somnolence 11/27/2021   Dream enactment behavior 11/27/2021   Obstructive sleep apnea syndrome 11/27/2021   Acquired hypothyroidism 11/27/2021   History of lumbar laminectomy for spinal cord decompression 06/04/2021   Cervical pseudoarthrosis (HCC) 03/20/2021   Vasomotor symptoms due to menopause 08/16/2019   Hypothyroidism 08/10/2019   Hyperlipidemia 08/10/2019   Past Medical History:  Diagnosis Date   Thyroid  disease     Family History  Problem Relation Age of Onset   Lung cancer Mother     Past Surgical History:  Procedure Laterality Date    APPENDECTOMY     68 years old   CERVICAL FUSION  2020   C5-6 ACDF   JOINT REPLACEMENT Bilateral    Hips   LUMBAR SPINE SURGERY  2020   [facility]   THYROIDECTOMY     Social History   Occupational History   Not on file  Tobacco Use   Smoking status: Every Day    Current packs/day: 0.50    Average packs/day: 0.5 packs/day for 38.0 years (19.0 ttl pk-yrs)    Types: Cigarettes   Smokeless tobacco: Never  Vaping Use   Vaping status: Never Used  Substance and Sexual Activity   Alcohol use: Not on file    Comment: socially   Drug use: Not Currently   Sexual activity: Not on file

## 2023-09-01 ENCOUNTER — Ambulatory Visit (INDEPENDENT_AMBULATORY_CARE_PROVIDER_SITE_OTHER): Payer: Medicare HMO | Admitting: Surgical

## 2023-09-01 ENCOUNTER — Encounter: Payer: Self-pay | Admitting: Surgical

## 2023-09-01 DIAGNOSIS — M25512 Pain in left shoulder: Secondary | ICD-10-CM

## 2023-09-01 MED ORDER — GABAPENTIN 600 MG PO TABS: 600.0000 mg | ORAL_TABLET | Freq: Three times a day (TID) | ORAL | 0 refills | Status: AC

## 2023-09-01 NOTE — Progress Notes (Signed)
 Patient reported for MRI review today.  She already reviewed MRI with Dr. Rozelle Corning last week and has MRI cervical spine scheduled for 09/08/2023.  No charge for visit today and she will return about 1 week after her C-spine MRI to review with myself or Dr. Rozelle Corning.  Gabapentin  refilled today.

## 2023-09-03 ENCOUNTER — Encounter: Payer: Self-pay | Admitting: Orthopedic Surgery

## 2023-09-08 ENCOUNTER — Ambulatory Visit
Admission: RE | Admit: 2023-09-08 | Discharge: 2023-09-08 | Disposition: A | Discharge status: Home / Self Care | Payer: Medicare HMO | Source: Ambulatory Visit | Attending: Orthopedic Surgery | Admitting: Orthopedic Surgery

## 2023-09-08 DIAGNOSIS — M5412 Radiculopathy, cervical region: Secondary | ICD-10-CM

## 2023-09-19 ENCOUNTER — Ambulatory Visit: Payer: Medicare HMO | Admitting: Surgical

## 2023-09-19 ENCOUNTER — Encounter: Payer: Self-pay | Admitting: Surgical

## 2023-09-19 DIAGNOSIS — M19012 Primary osteoarthritis, left shoulder: Secondary | ICD-10-CM | POA: Diagnosis not present

## 2023-09-19 MED ORDER — DIAZEPAM 2 MG PO TABS: 2.0000 mg | ORAL_TABLET | Freq: Once | ORAL | 0 refills | Status: AC

## 2023-09-19 NOTE — Progress Notes (Signed)
 Office Visit Note   Patient: Tina Bartlett           Date of Birth: 04-Oct-1955           MRN: [id number] Visit Date: 09/19/2023 Requested by: Sarita Bottom, NP [address] [place],  [place] [address] PCP: Sarita Bottom, NP  Subjective: Chief Complaint  Patient presents with   Neck - Follow-up    HPI: Tina Bartlett is a 68 y.o. female who presents to the office for MRI review. Patient denies any changes in symptoms.  Continues to complain mainly of primarily lateral shoulder pain that radiates to the elbow and very occasionally into the dorsal forearm.  She has occasional trapezius pain and scapular pain.  She describes fairly consistent and constant left shoulder pain without any right arm symptoms.  The symptoms will get worse after she does certain activities such as using a mop.  She does not have any numbness or tingling in the shoulder distribution but will have occasional numbness in the left hand.  She takes gabapentin twice a day that does help with some of her symptoms.  Lidocaine patches have not really helped at all.  MRI results revealed: MR Cervical Spine w/o contrast Result Date: 09/19/2023 CLINICAL DATA:  Cervical pain with left upper extremity radicular pain EXAM: MRI CERVICAL SPINE WITHOUT CONTRAST TECHNIQUE: Multiplanar, multisequence MR imaging of the cervical spine was performed. No intravenous contrast was administered. COMPARISON:  MRI cervical spine October 22,022 FINDINGS: Alignment: Physiologic. Vertebrae: No fracture, evidence of discitis, or bone lesion. Cord: Normal signal and morphology. Posterior Fossa, vertebral arteries, paraspinal tissues: Negative. Disc levels: Status post ACDF C5-C6 C6-C7 C2-C3: No significant central canal narrowing. No significant neural foraminal narrowing. C3-C4: Comparison with prior examinations demonstrates a small central spondylitic bulging disc or disc protrusion effacing the ventral anterior epidural space flattening of the  thecal sac and cord without evidence of cord compression unchanged since prior examination. No significant foraminal narrowing on today's images. No significant neural foraminal narrowing. C4-C5: Narrowing of the disc space desiccated disc material with a small central, right and left paracentral spondylitic disc herniation effacing the ventral anterior epidural space flattening the thecal sac and cord without cord compression producing minimal left foraminal narrowing. C5-C6: Post ACDF. No disc herniation canal stenosis or foraminal narrowing. No significant neural foraminal narrowing. C6-C7: Post ACDF. No disc herniation canal stenosis or foraminal narrowing. No significant neural foraminal narrowing. C7-T1: No significant central canal narrowing. No significant neural foraminal narrowing. IMPRESSION:  STABLE APPEARING C3-C4 DISC PROTRUSION AS DESCRIBED WHEN COMPARISON WITH PRIOR EXAMINATION.: IMPRESSION: STABLE APPEARING C3-C4 DISC PROTRUSION AS DESCRIBED WHEN COMPARISON WITH PRIOR EXAMINATION. Small spondylitic central C4-5 disc herniation as described. Electronically Signed   By: Shaaron Adler M.D.   On: 09/19/2023 12:45   MR Shoulder Left w/ contrast Result Date: 08/27/2023 CLINICAL DATA:  Left shoulder pain with decreased range of motion for 6 months EXAM: MRI OF THE LEFT SHOULDER WITH CONTRAST TECHNIQUE: Multiplanar, multisequence MR imaging of the left shoulder was performed following the administration of intra-articular contrast. CONTRAST:  See Injection Documentation. COMPARISON:  None Available. FINDINGS: Rotator cuff: Moderate supraspinatus tendinosis with a small insertional interstitial tear. Moderate infraspinatus tendinosis. Teres minor tendon is intact. Subscapularis tendon is intact. Muscles: No muscle atrophy or edema. No intramuscular fluid collection or hematoma. Biceps Long Head: Intraarticular and extraarticular portions of the biceps tendon are intact. Acromioclavicular Joint: Moderate  arthropathy of the acromioclavicular joint. Trace subacromial/subdeltoid bursal fluid. Glenohumeral Joint:  Intraarticular contrast distending the joint capsule. Normal glenohumeral ligaments. High-grade partial-thickness cartilage loss of the glenohumeral joint with areas of full-thickness cartilage loss. Labrum: Intact. Bones: No fracture or dislocation. No aggressive osseous lesion. Other: No fluid collection or hematoma. IMPRESSION: 1. Moderate supraspinatus tendinosis with a small insertional interstitial tear. 2. Moderate infraspinatus tendinosis. 3. Moderate osteoarthritis of the left glenohumeral joint. Electronically Signed   By: Elige Ko M.D.   On: 08/27/2023 16:13                 ROS: All systems reviewed are negative as they relate to the chief complaint within the history of present illness.  Patient denies fevers or chills.  Assessment & Plan: Visit Diagnoses:  1. Arthritis of left glenohumeral joint     Plan: Tina Bartlett is a 68 y.o. female who presents to the office for review of MRI of the cervical spine.  MRI does demonstrate some disc pathology with mild foraminal narrowing on the left at C4-C5.  This does not seem severe enough to be causing her symptoms though it is not out of the question.  She has had prior cervical spine ESI that gave her no relief for any amount of time.  She has also had prior glenohumeral injection for her glenohumeral arthritis that gave her about 40 to 50% relief temporarily.  We discussed options available to patient including physical therapy versus consult with spine surgeon versus shoulder surgery versus repeating injections.  She hates needles and wants to avoid injections at all possible.  However, right now the main driver of her shoulder pain is somewhat up in the air between referred pain from the neck versus from the shoulder arthritis.  Based on diagnostic injections, seems that the shoulder is more of the pain generator but I think the best  way to find this out would be to try a diagnostic Marcaine injection under ultrasound guidance into the glenohumeral joint.  Patient agreed with this plan but she would like to hold off on injection today and wants to postpone this until next week.  We will bring her back in 1 week to repeat the injection and we will prescribe 1 dose of Valium in order to ease the severe anxiety and discomfort that she typically has.  She will have her husband drive her.  Follow-Up Instructions: No follow-ups on file.   Orders:  No orders of the defined types were placed in this encounter.  No orders of the defined types were placed in this encounter.     Procedures: No procedures performed   Clinical Data: No additional findings.  Objective: Vital Signs: There were no vitals taken for this visit.  Physical Exam:  Constitutional: Patient appears well-developed HEENT:  Head: Normocephalic Eyes:EOM are normal Neck: Normal range of motion Cardiovascular: Normal rate Pulmonary/chest: Effort normal Neurologic: Patient is alert Skin: Skin is warm Psychiatric: Patient has normal mood and affect  Ortho Exam: Ortho exam demonstrates left shoulder with 40 degrees X rotation, 85 degrees abduction, 160 degrees forward elevation passively.  Intact rotator cuff strength of supra, infra, subscap rated 5/5.  Does reproduce her pain however.  Tenderness over the bicipital groove moderate to severely.  She does have some mild to moderate tenderness over the [facility] joint as well.  No deformity noted around the shoulder region.  Axillary nerves intact with deltoid firing.  She has intact EPL, FPL, finger abduction, pronation/supination, bicep, deltoid.  Supination strength testing does reproduce her pain.  She has  tricep strength of the left arm rated 5 -/5 relative to the right arm which is rated 5/5.  She does have a little bit of crepitus noted with passive motion of the left shoulder.  Specialty Comments:  CLINICAL  DATA:  MR cervical spine eval for source of radicular arm pain   EXAM: MRI CERVICAL SPINE WITHOUT CONTRAST   TECHNIQUE: Multiplanar, multisequence MR imaging of the cervical spine was performed. No intravenous contrast was administered.   COMPARISON:  CT of the cervical spine 02/15/2021.   FINDINGS: Alignment: Straightening of the normal cervical lordosis. No substantial sagittal subluxation.   Vertebrae: Vertebral body heights are maintained. No focal marrow edema to suggest acute fracture discitis/osteomyelitis. C5-C7 ACDF.   Cord: Normal cord signal.   Posterior Fossa, vertebral arteries, paraspinal tissues: Visualized vertebral artery flow voids are maintained. Unremarkable visualized posterior fossa. No paraspinal edema.   Disc levels:   C2-C3: No significant disc protrusion, foraminal stenosis, or canal stenosis.   C3-C4: Right paracentral disc protrusion deforms the right ventral cord with moderate canal stenosis. Left greater than right facet and uncovertebral hypertrophy with moderate left foraminal stenosis. No significant right foraminal stenosis.   C4-C5: Posterior disc osteophyte complex with central annular fissure. Mild canal stenosis. Left greater than right facet and uncovertebral degenerative with moderate left and mild right foraminal stenosis.   C5-C6: ACDF. Right greater than left facet and uncovertebral hypertrophy. Moderate right foraminal stenosis. No significant canal or left foraminal stenosis.   C6-C7: ACDF. Slight disc bulging. Right greater than left facet and uncovertebral hypertrophy. Resulting moderate right foraminal stenosis. No significant canal or left foraminal stenosis.   C7-T1: No significant disc protrusion, foraminal stenosis, or canal stenosis.   IMPRESSION: 1. At C3-C4, right paracentral disc protrusion deforms the right ventral cord with moderate canal stenosis. Moderate left foraminal stenosis. 2. At C4-C5, mild canal  stenosis with moderate left and mild right foraminal stenosis. 3. C5-C7 ACDF with moderate right foraminal stenosis at C5-C6 and C6-C7. Patent canal.     Electronically Signed   By: Feliberto Harts M.D.   On: 05/11/2021 09:42  Imaging: No results found.   PMFS History: Patient Active Problem List   Diagnosis Date Noted   Acute encephalopathy 07/30/2023   Overdose 07/30/2023   Daytime somnolence 11/27/2021   Dream enactment behavior 11/27/2021   Obstructive sleep apnea syndrome 11/27/2021   Acquired hypothyroidism 11/27/2021   History of lumbar laminectomy for spinal cord decompression 06/04/2021   Cervical pseudoarthrosis (HCC) 03/20/2021   Vasomotor symptoms due to menopause 08/16/2019   Hypothyroidism 08/10/2019   Hyperlipidemia 08/10/2019   Past Medical History:  Diagnosis Date   Thyroid disease     Family History  Problem Relation Age of Onset   Lung cancer Mother     Past Surgical History:  Procedure Laterality Date   APPENDECTOMY     68 years old   CERVICAL FUSION  2020   C5-6 ACDF   JOINT REPLACEMENT Bilateral    Hips   LUMBAR SPINE SURGERY  2020   [facility] [place]   THYROIDECTOMY     Social History   Occupational History   Not on file  Tobacco Use   Smoking status: Every Day    Current packs/day: 0.50    Average packs/day: 0.5 packs/day for 38.0 years (19.0 ttl pk-yrs)    Types: Cigarettes   Smokeless tobacco: Never  Vaping Use   Vaping status: Never Used  Substance and Sexual Activity   Alcohol  use: Not on file    Comment: socially   Drug use: Not Currently   Sexual activity: Not on file

## 2023-09-26 ENCOUNTER — Other Ambulatory Visit: Payer: Self-pay

## 2023-09-26 ENCOUNTER — Ambulatory Visit (INDEPENDENT_AMBULATORY_CARE_PROVIDER_SITE_OTHER): Payer: Medicare HMO | Admitting: Surgical

## 2023-09-26 DIAGNOSIS — M19012 Primary osteoarthritis, left shoulder: Secondary | ICD-10-CM

## 2023-09-26 DIAGNOSIS — M7522 Bicipital tendinitis, left shoulder: Secondary | ICD-10-CM

## 2023-09-26 MED ORDER — DIAZEPAM 2 MG PO TABS: 2.0000 mg | ORAL_TABLET | Freq: Every evening | ORAL | 0 refills | Status: AC | PRN

## 2023-09-27 ENCOUNTER — Encounter: Payer: Self-pay | Admitting: Surgical

## 2023-09-27 NOTE — Progress Notes (Signed)
 Office Visit Note   Patient: Tina Bartlett           Date of Birth: 01-12-1956           MRN: [id number] Visit Date: 09/26/2023 Requested by: Sarita Bottom, NP [address] [place],  [place] [address] PCP: Sarita Bottom, NP  Subjective: Chief Complaint  Patient presents with   Left Shoulder - Follow-up    HPI: Tina Bartlett is a 68 y.o. female who presents to the office reporting left shoulder pain.  Patient has fairly consistent near constant left shoulder pain that does get worse with activity.  She returns today for planned glenohumeral diagnostic lidocaine/Marcaine injection.  She describes pain in the anterior lateral aspect of the shoulder with radiation down to the elbow primarily.  She has very sparing radiation into the forearm but no numbness or tingling in her hand consistently.  She has no scapular pain.  Occasional trapezius pain but no neck pain.  Pain is worse after she does activities such as cleaning her house.  Specifically, the worst pain is when she tries to bring her arm overhead or lift things away from her body.  She has pain that makes it very difficult for her to sleep.  Had recent cervical spine MRI that was reviewed at a previous visit.  She also had prior glenohumeral injection that gave her partial relief of her pain and prior cervical spine ESI that gave her no relief of her pain..                ROS: All systems reviewed are negative as they relate to the chief complaint within the history of present illness.  Patient denies fevers or chills.  Assessment & Plan: Visit Diagnoses:  1. Arthritis of left glenohumeral joint     Plan: Patient is a 68 year old female who presents for evaluation of left shoulder pain.  It has been somewhat unclear whether the main source of her symptoms are coming from her shoulder or from her cervical spine.  She is here today for diagnostic injection.  5 cc of lidocaine mixed with 5 cc of Marcaine was administered into the  glenohumeral joint under ultrasound guidance.  15 minutes after injection, patient was reevaluated with "about 90%" relief of her symptoms and increased ability to lift her arm overhead without pain.  Based on this, seems that the vast majority of her pain is shoulder related and thus we discussed options available for her.  Main options would be repeating cortisone injection versus PRP injection versus physical therapy versus surgery.  Surgical options include shoulder arthroscopy with bicep tenodesis and labral debridement with possible rotator cuff tear repair versus shoulder arthroplasty.  She states that she is not quite ready for having her shoulder replaced and she would like to pursue arthroscopy with bicep tenodesis and debridement.  She understands that based on the presence of arthritis, there is a strong possibility that she will have continued pain after surgery to some degree but the goal for surgical intervention would be to decrease the severe pain and get her back to her pain level where she is more functional with her arm.  Risk and benefits of the procedure were discussed as well as the recovery timeframe.  Follow-up after procedure. ***  Follow-Up Instructions: No follow-ups on file.   Orders:  Orders Placed This Encounter  Procedures   US Guided Needle Placement - No Linked Charges   Meds ordered this encounter  Medications  diazepam (VALIUM) 2 MG tablet    Sig: Take 1 tablet (2 mg total) by mouth at bedtime as needed for anxiety.    Dispense:  5 tablet    Refill:  0      Procedures: Large Joint Inj: L glenohumeral on 09/26/2023 10:51 AM Details: 25 G 1.5 in needle, ultrasound-guided posterior approach Medications: 5 mL lidocaine 1 %; 5 mL bupivacaine 0.25 % Outcome: tolerated well, no immediate complications Procedure, treatment alternatives, risks and benefits explained, specific risks discussed. Consent was given by the patient. Patient was prepped and draped in the  usual sterile fashion.       Clinical Data: No additional findings.  Objective: Vital Signs: There were no vitals taken for this visit.  Physical Exam:  Constitutional: Patient appears well-developed HEENT:  Head: Normocephalic Eyes:EOM are normal Neck: Normal range of motion Cardiovascular: Normal rate Pulmonary/chest: Effort normal Neurologic: Patient is alert Skin: Skin is warm Psychiatric: Patient has normal mood and affect  Ortho Exam: Ortho exam demonstrates left shoulder with about 30 degrees external rotation, 90 degrees abduction, 150 degrees forward elevation passively.  She has mild tenderness over the [facility] joint.  Moderate tenderness of the bicipital groove.  Positive O'Brien sign.  Excellent rotator cuff strength of supra, infra, subscap with some reproduction of pain with infraspinatus strength testing.  Axillary nerve is intact with deltoid firing.  Does have reproduction of pain with supination strength testing.  Bicep contour is equivalent to the contralateral side without Popeye deformity.  She has 2+ radial pulse of the left upper extremity.  There is some coarseness and grinding noted with passive motion of the left shoulder consistent with osteoarthritis.  Specialty Comments:  CLINICAL DATA:  MR cervical spine eval for source of radicular arm pain   EXAM: MRI CERVICAL SPINE WITHOUT CONTRAST   TECHNIQUE: Multiplanar, multisequence MR imaging of the cervical spine was performed. No intravenous contrast was administered.   COMPARISON:  CT of the cervical spine 02/15/2021.   FINDINGS: Alignment: Straightening of the normal cervical lordosis. No substantial sagittal subluxation.   Vertebrae: Vertebral body heights are maintained. No focal marrow edema to suggest acute fracture discitis/osteomyelitis. C5-C7 ACDF.   Cord: Normal cord signal.   Posterior Fossa, vertebral arteries, paraspinal tissues: Visualized vertebral artery flow voids are maintained.  Unremarkable visualized posterior fossa. No paraspinal edema.   Disc levels:   C2-C3: No significant disc protrusion, foraminal stenosis, or canal stenosis.   C3-C4: Right paracentral disc protrusion deforms the right ventral cord with moderate canal stenosis. Left greater than right facet and uncovertebral hypertrophy with moderate left foraminal stenosis. No significant right foraminal stenosis.   C4-C5: Posterior disc osteophyte complex with central annular fissure. Mild canal stenosis. Left greater than right facet and uncovertebral degenerative with moderate left and mild right foraminal stenosis.   C5-C6: ACDF. Right greater than left facet and uncovertebral hypertrophy. Moderate right foraminal stenosis. No significant canal or left foraminal stenosis.   C6-C7: ACDF. Slight disc bulging. Right greater than left facet and uncovertebral hypertrophy. Resulting moderate right foraminal stenosis. No significant canal or left foraminal stenosis.   C7-T1: No significant disc protrusion, foraminal stenosis, or canal stenosis.   IMPRESSION: 1. At C3-C4, right paracentral disc protrusion deforms the right ventral cord with moderate canal stenosis. Moderate left foraminal stenosis. 2. At C4-C5, mild canal stenosis with moderate left and mild right foraminal stenosis. 3. C5-C7 ACDF with moderate right foraminal stenosis at C5-C6 and C6-C7. Patent canal.     Electronically  Signed   By: Feliberto Harts M.D.   On: 05/11/2021 09:42  Imaging: No results found.   PMFS History: Patient Active Problem List   Diagnosis Date Noted   Acute encephalopathy 07/30/2023   Overdose 07/30/2023   Daytime somnolence 11/27/2021   Dream enactment behavior 11/27/2021   Obstructive sleep apnea syndrome 11/27/2021   Acquired hypothyroidism 11/27/2021   History of lumbar laminectomy for spinal cord decompression 06/04/2021   Cervical pseudoarthrosis (HCC) 03/20/2021   Vasomotor symptoms  due to menopause 08/16/2019   Hypothyroidism 08/10/2019   Hyperlipidemia 08/10/2019   Past Medical History:  Diagnosis Date   Thyroid disease     Family History  Problem Relation Age of Onset   Lung cancer Mother     Past Surgical History:  Procedure Laterality Date   APPENDECTOMY     68 years old   CERVICAL FUSION  2020   C5-6 ACDF   JOINT REPLACEMENT Bilateral    Hips   LUMBAR SPINE SURGERY  2020   [facility] [place]   THYROIDECTOMY     Social History   Occupational History   Not on file  Tobacco Use   Smoking status: Every Day    Current packs/day: 0.50    Average packs/day: 0.5 packs/day for 38.0 years (19.0 ttl pk-yrs)    Types: Cigarettes   Smokeless tobacco: Never  Vaping Use   Vaping status: Never Used  Substance and Sexual Activity   Alcohol use: Not on file    Comment: socially   Drug use: Not Currently   Sexual activity: Not on file

## 2023-09-30 ENCOUNTER — Other Ambulatory Visit: Payer: Self-pay

## 2023-09-30 DIAGNOSIS — M19012 Primary osteoarthritis, left shoulder: Secondary | ICD-10-CM

## 2023-09-30 MED ORDER — BUPIVACAINE HCL 0.25 % IJ SOLN
5.0000 mL | INTRAMUSCULAR | Status: AC | PRN
Start: 2023-09-26 — End: 2023-09-26
  Administered 2023-09-26: 5 mL via INTRA_ARTICULAR

## 2023-09-30 MED ORDER — LIDOCAINE HCL 1 % IJ SOLN
5.0000 mL | INTRAMUSCULAR | Status: AC | PRN
  Administered 2023-09-26: 5 mL

## 2023-10-01 ENCOUNTER — Encounter: Payer: Self-pay | Admitting: Orthopedic Surgery

## 2023-10-16 ENCOUNTER — Ambulatory Visit
Admission: RE | Admit: 2023-10-16 | Discharge: 2023-10-16 | Disposition: A | Discharge status: Home / Self Care | Source: Ambulatory Visit | Attending: Orthopedic Surgery | Admitting: Orthopedic Surgery

## 2023-10-16 DIAGNOSIS — M19012 Primary osteoarthritis, left shoulder: Secondary | ICD-10-CM

## 2023-10-29 ENCOUNTER — Ambulatory Visit: Admitting: Surgical

## 2023-10-29 ENCOUNTER — Encounter: Payer: Self-pay | Admitting: Surgical

## 2023-10-29 DIAGNOSIS — M19012 Primary osteoarthritis, left shoulder: Secondary | ICD-10-CM | POA: Diagnosis not present

## 2023-10-29 NOTE — Progress Notes (Signed)
 Office Visit Note   Patient: Tina Bartlett           Date of Birth: 1956-02-15           MRN: [id number] Visit Date: 10/29/2023 Requested by: Sarita Bottom, NP [address] [place],  [place] [address] PCP: Sarita Bottom, NP  Subjective: Chief Complaint  Patient presents with   Left Shoulder - Pain    HPI: Tina Bartlett is a 68 y.o. female who presents to the office reporting left shoulder pain.  Is here to review CT scan that was obtained for preoperative planning purposes.  Still complains primarily of anterolateral shoulder pain with radiation down to the elbow.  No significant radicular pain past the elbow.  She states that this pain is life altering and limits basically her ability to do any ADLs..                ROS: All systems reviewed are negative as they relate to the chief complaint within the history of present illness.  Patient denies fevers or chills.  Assessment & Plan: Visit Diagnoses:  1. Arthritis of left glenohumeral joint     Plan: Patient is a 68 year old female who presents for evaluation of left shoulder pain.  She has glenohumeral arthritis that has substantially improved with diagnostic lidocaine injection.  We have discussed surgical options over the phone in the past and she would like to proceed with shoulder arthroplasty as the best shot at 1 definitive surgical intervention.  We again discussed shoulder placement today.  Think reverse replacement is the best option for her given her history of smoking and her partial-thickness rotator cuff damage of the supraspinatus on the MRI scan.  We discussed the surgical process and recovery timeline as well as the potential risks and benefits of the procedure including not limited to the risk of nerve/blood vessel damage, instability, need for revision surgery, medical complication.  She would like to proceed with surgery regardless.  She will be posted for left reverse shoulder arthroplasty and follow-up after  procedure.  She denies any significant medical history but she will make efforts to limit her smoking as we approach surgical date.  Follow-Up Instructions: No follow-ups on file.   Orders:  No orders of the defined types were placed in this encounter.  No orders of the defined types were placed in this encounter.     Procedures: No procedures performed   Clinical Data: No additional findings.  Objective: Vital Signs: There were no vitals taken for this visit.  Physical Exam:  Constitutional: Patient appears well-developed HEENT:  Head: Normocephalic Eyes:EOM are normal Neck: Normal range of motion Cardiovascular: Normal rate Pulmonary/chest: Effort normal Neurologic: Patient is alert Skin: Skin is warm Psychiatric: Patient has normal mood and affect  Ortho Exam: Ortho exam demonstrates left shoulder with 45 degrees X rotation, 90 degrees abduction, 160 degrees forward elevation passively and actively.  Intact rotator cuff strength of supra, infra, subscap rated 5/5.  She has intact EPL, FPL, finger abduction, bicep flexion, supination, pronation, tricep extension strength rated 5/5.  Axillary nerve is intact with deltoid firing.  No cellulitis skin changes or any scars overlying the anterior aspect of the shoulder.  Continued moderate to severe tenderness over the bicipital groove.  She has increased pain with passive motion of the shoulder, particularly with terminal external rotation.  2+ radial pulse of the left upper extremity.  Specialty Comments:  CLINICAL DATA:  MR cervical spine eval for source of  radicular arm pain   EXAM: MRI CERVICAL SPINE WITHOUT CONTRAST   TECHNIQUE: Multiplanar, multisequence MR imaging of the cervical spine was performed. No intravenous contrast was administered.   COMPARISON:  CT of the cervical spine 02/15/2021.   FINDINGS: Alignment: Straightening of the normal cervical lordosis. No substantial sagittal subluxation.   Vertebrae:  Vertebral body heights are maintained. No focal marrow edema to suggest acute fracture discitis/osteomyelitis. C5-C7 ACDF.   Cord: Normal cord signal.   Posterior Fossa, vertebral arteries, paraspinal tissues: Visualized vertebral artery flow voids are maintained. Unremarkable visualized posterior fossa. No paraspinal edema.   Disc levels:   C2-C3: No significant disc protrusion, foraminal stenosis, or canal stenosis.   C3-C4: Right paracentral disc protrusion deforms the right ventral cord with moderate canal stenosis. Left greater than right facet and uncovertebral hypertrophy with moderate left foraminal stenosis. No significant right foraminal stenosis.   C4-C5: Posterior disc osteophyte complex with central annular fissure. Mild canal stenosis. Left greater than right facet and uncovertebral degenerative with moderate left and mild right foraminal stenosis.   C5-C6: ACDF. Right greater than left facet and uncovertebral hypertrophy. Moderate right foraminal stenosis. No significant canal or left foraminal stenosis.   C6-C7: ACDF. Slight disc bulging. Right greater than left facet and uncovertebral hypertrophy. Resulting moderate right foraminal stenosis. No significant canal or left foraminal stenosis.   C7-T1: No significant disc protrusion, foraminal stenosis, or canal stenosis.   IMPRESSION: 1. At C3-C4, right paracentral disc protrusion deforms the right ventral cord with moderate canal stenosis. Moderate left foraminal stenosis. 2. At C4-C5, mild canal stenosis with moderate left and mild right foraminal stenosis. 3. C5-C7 ACDF with moderate right foraminal stenosis at C5-C6 and C6-C7. Patent canal.     Electronically Signed   By: Feliberto Harts M.D.   On: 05/11/2021 09:42  Imaging: No results found.   PMFS History: Patient Active Problem List   Diagnosis Date Noted   Acute encephalopathy 07/30/2023   Overdose 07/30/2023   Daytime somnolence  11/27/2021   Dream enactment behavior 11/27/2021   Obstructive sleep apnea syndrome 11/27/2021   Acquired hypothyroidism 11/27/2021   History of lumbar laminectomy for spinal cord decompression 06/04/2021   Cervical pseudoarthrosis (HCC) 03/20/2021   Vasomotor symptoms due to menopause 08/16/2019   Hypothyroidism 08/10/2019   Hyperlipidemia 08/10/2019   Past Medical History:  Diagnosis Date   Thyroid disease     Family History  Problem Relation Age of Onset   Lung cancer Mother     Past Surgical History:  Procedure Laterality Date   APPENDECTOMY     68 years old   CERVICAL FUSION  2020   C5-6 ACDF   JOINT REPLACEMENT Bilateral    Hips   LUMBAR SPINE SURGERY  2020   [facility] [place]   THYROIDECTOMY     Social History   Occupational History   Not on file  Tobacco Use   Smoking status: Every Day    Current packs/day: 0.50    Average packs/day: 0.5 packs/day for 38.0 years (19.0 ttl pk-yrs)    Types: Cigarettes   Smokeless tobacco: Never  Vaping Use   Vaping status: Never Used  Substance and Sexual Activity   Alcohol use: Not on file    Comment: socially   Drug use: Not Currently   Sexual activity: Not on file

## 2023-10-30 NOTE — Progress Notes (Signed)
 She posted?

## 2023-10-30 NOTE — Progress Notes (Signed)
 yes

## 2023-11-13 ENCOUNTER — Telehealth: Payer: Self-pay | Admitting: Orthopedic Surgery

## 2023-11-13 DIAGNOSIS — M19012 Primary osteoarthritis, left shoulder: Secondary | ICD-10-CM

## 2023-11-13 NOTE — Telephone Encounter (Signed)
 Called patient to offer surgery date.  She stated she is afraid to have the shoulder replacement and would like to know if physical therapy can help her.  Please call patient at [phone].

## 2023-11-13 NOTE — Telephone Encounter (Signed)
 Ok for pt 2 x week for 6 weeks thx - do not know if it will help

## 2023-11-13 NOTE — Telephone Encounter (Signed)
 Pt checking the status of surgery

## 2023-11-14 NOTE — Addendum Note (Signed)
 Addended by: Catherene Close on: 11/14/2023 03:56 PM   Modules accepted: Orders

## 2023-11-14 NOTE — Telephone Encounter (Signed)
 PT order placed in chart. Tried calling pt to advise. No answer and no voice mail

## 2023-11-17 NOTE — Telephone Encounter (Signed)
 Talked to pt this morning. She is ready to set up PT. Order in chart thank you.

## 2023-11-26 ENCOUNTER — Other Ambulatory Visit: Payer: Self-pay

## 2023-11-26 ENCOUNTER — Ambulatory Visit: Attending: Orthopedic Surgery

## 2023-11-26 DIAGNOSIS — M542 Cervicalgia: Secondary | ICD-10-CM | POA: Diagnosis present

## 2023-11-26 DIAGNOSIS — M25512 Pain in left shoulder: Secondary | ICD-10-CM | POA: Insufficient documentation

## 2023-11-26 DIAGNOSIS — M6281 Muscle weakness (generalized): Secondary | ICD-10-CM | POA: Diagnosis present

## 2023-11-26 DIAGNOSIS — M19012 Primary osteoarthritis, left shoulder: Secondary | ICD-10-CM | POA: Insufficient documentation

## 2023-11-26 DIAGNOSIS — G8929 Other chronic pain: Secondary | ICD-10-CM | POA: Insufficient documentation

## 2023-11-26 DIAGNOSIS — R293 Abnormal posture: Secondary | ICD-10-CM | POA: Insufficient documentation

## 2023-11-26 NOTE — Therapy (Signed)
 OUTPATIENT PHYSICAL THERAPY SHOULDER EVALUATION   Patient Name: Tina Bartlett MRN: [id number] DOB:[birth year], 68 y.o., female Today's Date: 11/26/2023  END OF SESSION:  PT End of Session - 11/26/23 1306     Visit Number 1    Number of Visits 17    Date for PT Re-Evaluation 01/21/24    Authorization Type [employer] MCR    PT Start Time 1015    PT Stop Time 1058    PT Time Calculation (min) 43 min    Activity Tolerance Patient tolerated treatment well    Behavior During Therapy WFL for tasks assessed/performed             Past Medical History:  Diagnosis Date   Thyroid  disease    Past Surgical History:  Procedure Laterality Date   APPENDECTOMY     68 years old   CERVICAL FUSION  2020   C5-6 ACDF   JOINT REPLACEMENT Bilateral    Hips   LUMBAR SPINE SURGERY  2020   [facility] [place]   THYROIDECTOMY     Patient Active Problem List   Diagnosis Date Noted   Acute encephalopathy 07/30/2023   Overdose 07/30/2023   Daytime somnolence 11/27/2021   Dream enactment behavior 11/27/2021   Obstructive sleep apnea syndrome 11/27/2021   Acquired hypothyroidism 11/27/2021   History of lumbar laminectomy for spinal cord decompression 06/04/2021   Cervical pseudoarthrosis (HCC) 03/20/2021   Vasomotor symptoms due to menopause 08/16/2019   Hypothyroidism 08/10/2019   Hyperlipidemia 08/10/2019    PCP: Nils Baseman, NP  REFERRING PROVIDER: Jasmine Mesi, MD  REFERRING DIAG: [phone] (ICD-10-CM) - Arthritis of left glenohumeral joint   THERAPY DIAG:  Chronic left shoulder pain  Muscle weakness (generalized)  Abnormal posture  Cervicalgia  Rationale for Evaluation and Treatment: Rehabilitation  ONSET DATE: Chronic  SUBJECTIVE:                                                                                                                                                                                      SUBJECTIVE STATEMENT: Pt presents to with  reports of chronic L shoulder pain and discomfort. Denies trauma or MOI, notes that her R shoulder used to bother her but this got better while her L shoulder has been getting worse. Previous cervical and lumbar fusions and notes very frequent N/T and burning sensation down bilateral UE, L>R. Denies bowel/bladder changes or saddle anesthesia. She was scheduled for a L reverse TSA but she wants to try therapy first and hold off on surgery if she can. She occasionally cares for her nephew and is unable to lift him without shoulder pain right now.  Hand dominance: Right  PERTINENT HISTORY: Cervical and lumbar surgeries  PAIN:  Are you having pain?  Yes: NPRS scale: 7/10 Worst: 10/10 Pain location: L shoulder, L UE Pain description: sharp, burning, N/T Aggravating factors: sweeping, vacuuming, reaching OH Relieving factors: gabapentin , icy-hot  PRECAUTIONS: None  RED FLAGS: None   WEIGHT BEARING RESTRICTIONS: No  FALLS:  Has patient fallen in last 6 months? Yes. Number of falls: one fall due to dizziness  LIVING ENVIRONMENT: Lives with: lives with their family Lives in: House/apartment Stairs: one flight of stairs Has following equipment at home: None  OCCUPATION: Retired  PLOF: Independent  PATIENT GOALS: pt wants to decrease L shoulder pain in order to be able to reach overhead and lift her nephew - wants to prevent surgery if possible  NEXT MD VISIT: Not scheduled   OBJECTIVE:  Note: Objective measures were completed at Evaluation unless otherwise noted.  DIAGNOSTIC FINDINGS:  See imaging   PATIENT SURVEYS:  Quick DASH: 82 % disability  COGNITION: Overall cognitive status: Within functional limits for tasks assessed     SENSATION: Light touch: Impaired  C5-C6 L dermatome  POSTURE: Rounded shoulders, fwd head  UPPER EXTREMITY ROM:   Active ROM Right eval Left eval  Shoulder flexion WFL 60  Shoulder extension    Shoulder abduction WFL 50  Shoulder  adduction    Shoulder internal rotation    Shoulder external rotation WFL 25  Elbow flexion    Elbow extension    Wrist flexion    Wrist extension    Wrist ulnar deviation    Wrist radial deviation    Wrist pronation    Wrist supination    (Blank rows = not tested)  UPPER EXTREMITY MMT:  MMT Right eval Left eval  Shoulder flexion 4 2+  Shoulder extension    Shoulder abduction 4 2+  Shoulder adduction    Shoulder internal rotation    Shoulder external rotation 4 3  Middle trapezius    Lower trapezius    Elbow flexion    Elbow extension    Wrist flexion    Wrist extension    Wrist ulnar deviation    Wrist radial deviation    Wrist pronation    Wrist supination    Grip strength (lbs) 45lbs 40lbs  (Blank rows = not tested)  SHOULDER SPECIAL TESTS: DNT  JOINT MOBILITY TESTING:  L GH hypomobility   PALPATION:  TTP L infraspinatus                                                                                                                      TREATMENT: [facility] Adult PT Treatment:                                                DATE: 11/26/2023 Therapeutic Exercise: Seated scapular retraction x 5 - 5" hold  Seated row x 5 RTB L shoulder ER isometric x 5 - 5" hold  PATIENT EDUCATION: Education details: eval findings, Quick DASH, HEP, POC Person educated: Patient Education method: Explanation, Demonstration, and Handouts Education comprehension: verbalized understanding and returned demonstration  HOME EXERCISE PROGRAM: Access Code: [email or web address] URL: [email or web address] Date: 11/26/2023 Prepared by: Loral Roch  Exercises - Seated Scapular Retraction  - 1 x daily - 7 x weekly - 2 sets - 10 reps - 3 sec hold - Standing Shoulder Row with Anchored Resistance  - 1 x daily - 7 x weekly - 3 sets - 10 reps - red band hold - Standing Isometric Shoulder External Rotation with Doorway  - 1 x daily - 7 x weekly - 2 sets - 10 reps - 5 sec  hold  ASSESSMENT:  CLINICAL IMPRESSION: Patient is a 68 y.o. F who was seen today for physical therapy evaluation and treatment for chronic L shoulder pain. Physical findings are consistent with MD impression as pt demonstrates decrease in L shoulder strength and AROM. Quick DASH score indicates severe disability for performance of home ADLs and community activities. Pt would benefit from skilled PT services working on improving strength and ROM in order to decrease pain and improve shoulder function.     OBJECTIVE IMPAIRMENTS: decreased activity tolerance, decreased mobility, difficulty walking, decreased ROM, decreased strength, impaired sensation, impaired UE functional use, postural dysfunction, and pain  ACTIVITY LIMITATIONS: carrying, lifting, standing, squatting, stairs, reach over head, and hygiene/grooming  PARTICIPATION LIMITATIONS: meal prep, cleaning, driving, shopping, community activity, yard work, and school  PERSONAL FACTORS: Time since onset of injury/illness/exacerbation and 1-2 comorbidities: Cervical and lumbar surgeries  are also affecting patient's functional outcome.   REHAB POTENTIAL: Good - may be limited due to chronicity of shoulder pain  CLINICAL DECISION MAKING: Evolving/moderate complexity  EVALUATION COMPLEXITY: Moderate   GOALS: Goals reviewed with patient? No  SHORT TERM GOALS: Target date: 12/17/2023   Pt will be compliant and knowledgeable with initial HEP for improved comfort and carryover Baseline: initial HEP given  Goal status: INITIAL  2.  Pt will self report left shoulder pain no greater than 7/10 for improved comfort and functional ability Baseline: 10/10 at worst Goal status: INITIAL   LONG TERM GOALS: Target date: 01/21/2024   Pt will decrease Quick DASH disability score to no greater than 65% as proxy for functional improvement Baseline: 82% disability  Goal status: INITIAL  2.  Pt will self report left shoulder pain no greater than  3/10 for improved comfort and functional ability Baseline: 10/10 at worst Goal status: INITIAL   3.  Pt will improve L shoulder flexion to no less than 130 degrees for improved functional ability with ADL performance and OH reaching Baseline: 60 degrees Goal status: INITIAL  4.  Pt will improve L shoulder MMT to no less than 3+/5 for all tested motions for improved comfort and dynamic stabliity Baseline:  Goal status: INITIAL  5.  Pt will be able to lift 25lb KB with no increase in L shoulder pain for improved ability to lift her nephew Baseline: unable Goal status: INITIAL  PLAN:  PT FREQUENCY: 2x/week  PT DURATION: 8 weeks  PLANNED INTERVENTIONS: 97164- PT Re-evaluation, 97110-Therapeutic exercises, 97530- Therapeutic activity, [id number]- Neuromuscular re-education, 97535- Self Care, 16109- Manual therapy, [id number]- Gait training, U0454- Electrical stimulation (unattended), [id number]- Electrical stimulation (manual), 97016- Vasopneumatic device, Dry Needling, Cryotherapy, and Moist heat  PLAN FOR NEXT SESSION: assess HEP response, RTC and periscapular strengthening, supine shoulder flexion  progression  Referring diagnosis? M19.012 (ICD-10-CM) - Arthritis of left glenohumeral joint  Treatment diagnosis? (if different than referring diagnosis)  Chronic left shoulder pain Muscle weakness (generalized) Abnormal posture Cervicalgia  What was this (referring dx) caused by? []  Surgery []  Fall []  Ongoing issue [x]  Arthritis []  Other: ____________  Laterality: []  Rt [x]  Lt []  Both  Check all possible CPT codes:  *CHOOSE 10 OR LESS*    See Planned Interventions listed in the Plan section of the Evaluation.    Ivor Mars, PT 11/26/2023, 3:37 PM

## 2023-12-12 ENCOUNTER — Ambulatory Visit

## 2023-12-12 DIAGNOSIS — M6281 Muscle weakness (generalized): Secondary | ICD-10-CM

## 2023-12-12 DIAGNOSIS — R293 Abnormal posture: Secondary | ICD-10-CM

## 2023-12-12 DIAGNOSIS — M25512 Pain in left shoulder: Secondary | ICD-10-CM | POA: Diagnosis not present

## 2023-12-12 DIAGNOSIS — G8929 Other chronic pain: Secondary | ICD-10-CM

## 2023-12-12 DIAGNOSIS — M542 Cervicalgia: Secondary | ICD-10-CM

## 2023-12-12 NOTE — Therapy (Signed)
 OUTPATIENT PHYSICAL THERAPY TREATMENT   Patient Name: Tina Bartlett MRN: [id number] DOB:[birth year], 68 y.o., female Today's Date: 12/12/2023  END OF SESSION:  PT End of Session - 12/12/23 0929     Visit Number 2    Number of Visits 17    Date for PT Re-Evaluation 01/21/24    Authorization Type [employer] MCR    PT Start Time 0930    Activity Tolerance Patient tolerated treatment well    Behavior During Therapy WFL for tasks assessed/performed              Past Medical History:  Diagnosis Date   Thyroid  disease    Past Surgical History:  Procedure Laterality Date   APPENDECTOMY     68 years old   CERVICAL FUSION  2020   C5-6 [facility]CDF   JOINT REPLACEMENT Bilateral    Hips   LUMBAR SPINE SURGERY  2020   [facility] [place]   THYROIDECTOMY     Patient Active Problem List   Diagnosis Date Noted   Acute encephalopathy 07/30/2023   Overdose 07/30/2023   Daytime somnolence 11/27/2021   Dream enactment behavior 11/27/2021   Obstructive sleep apnea syndrome 11/27/2021   Acquired hypothyroidism 11/27/2021   History of lumbar laminectomy for spinal cord decompression 06/04/2021   Cervical pseudoarthrosis (HCC) 03/20/2021   Vasomotor symptoms due to menopause 08/16/2019   Hypothyroidism 08/10/2019   Hyperlipidemia 08/10/2019    PCP: Nils Baseman, NP  REFERRING PROVIDER: Jasmine Mesi, MD  REFERRING DIAG: [phone] (ICD-10-CM) - Arthritis of left glenohumeral joint   THERAPY DIAG:  Chronic left shoulder pain  Muscle weakness (generalized)  Abnormal posture  Cervicalgia  Rationale for Evaluation and Treatment: Rehabilitation  ONSET DATE: Chronic  SUBJECTIVE:                                                                                                                                                                                      SUBJECTIVE STATEMENT: Pt presents to PT with reports of continued L shoulder and neck pain. Has been fairly  compliant with HEP, does note some improvement in symptoms.   EVAL: Pt presents to with reports of chronic L shoulder pain and discomfort. Denies trauma or MOI, notes that her R shoulder used to bother her but this got better while her L shoulder has been getting worse. Previous cervical and lumbar fusions and notes very frequent N/T and burning sensation down bilateral UE, L>R. Denies bowel/bladder changes or saddle anesthesia. She was scheduled for a L reverse TSA but she wants to try therapy first and hold off on surgery if she can. She occasionally cares for her nephew  and is unable to lift him without shoulder pain right now.  Hand dominance: Right  PERTINENT HISTORY: Cervical and lumbar surgeries  PAIN:  Are you having pain?  Yes: NPRS scale: 7/10 Worst: 10/10 Pain location: L shoulder, L UE Pain description: sharp, burning, N/T Aggravating factors: sweeping, vacuuming, reaching OH Relieving factors: gabapentin , icy-hot  PRECAUTIONS: None  RED FLAGS: None   WEIGHT BEARING RESTRICTIONS: No  FALLS:  Has patient fallen in last 6 months? Yes. Number of falls: one fall due to dizziness  LIVING ENVIRONMENT: Lives with: lives with their family Lives in: House/apartment Stairs: one flight of stairs Has following equipment at home: None  OCCUPATION: Retired  PLOF: Independent  PATIENT GOALS: pt wants to decrease L shoulder pain in order to be able to reach overhead and lift her nephew - wants to prevent surgery if possible  NEXT MD VISIT: Not scheduled   OBJECTIVE:  Note: Objective measures were completed at Evaluation unless otherwise noted.  DIAGNOSTIC FINDINGS:  See imaging   PATIENT SURVEYS:  Quick DASH: 82 % disability  COGNITION: Overall cognitive status: Within functional limits for tasks assessed     SENSATION: Light touch: Impaired  C5-C6 L dermatome  POSTURE: Rounded shoulders, fwd head  UPPER EXTREMITY ROM:   Active ROM Right eval Left eval   Shoulder flexion WFL 60  Shoulder extension    Shoulder abduction WFL 50  Shoulder adduction    Shoulder internal rotation    Shoulder external rotation WFL 25  Elbow flexion    Elbow extension    Wrist flexion    Wrist extension    Wrist ulnar deviation    Wrist radial deviation    Wrist pronation    Wrist supination    (Blank rows = not tested)  UPPER EXTREMITY MMT:  MMT Right eval Left eval  Shoulder flexion 4 2+  Shoulder extension    Shoulder abduction 4 2+  Shoulder adduction    Shoulder internal rotation    Shoulder external rotation 4 3  Middle trapezius    Lower trapezius    Elbow flexion    Elbow extension    Wrist flexion    Wrist extension    Wrist ulnar deviation    Wrist radial deviation    Wrist pronation    Wrist supination    Grip strength (lbs) 45lbs 40lbs  (Blank rows = not tested)  SHOULDER SPECIAL TESTS: DNT  JOINT MOBILITY TESTING:  L GH hypomobility   PALPATION:  TTP L infraspinatus                                                                                                                      TREATMENT: [facility] Adult PT Treatment:  DATE: 12/12/2023 UBE lvl 1.0 x 3 min for functional activity tolerance Supine shoulder flexion 2x10 L  Supine bilateral ER 2x10 RTB Supine dow flexion 2x10  Seated row 2x10 RTB L shoulder ER isometric x 5 - 5" hold Manual Therapy:  STM to L upper trap Positional release to L upper trap Suboccipital release  [facility] Adult PT Treatment:                                                DATE: 11/26/2023 Therapeutic Exercise: Seated scapular retraction x 5 - 5" hold Seated row x 5 RTB L shoulder ER isometric x 5 - 5" hold  PATIENT EDUCATION: Education details: HEP update Person educated: Patient Education method: Explanation, Demonstration, and Handouts Education comprehension: verbalized understanding and returned demonstration  HOME EXERCISE  PROGRAM: Access Code: [email or web address] URL: [email or web address] Date: 12/12/2023 Prepared by: Loral Roch  Exercises - Seated Scapular Retraction  - 1 x daily - 7 x weekly - 2 sets - 10 reps - 3 sec hold - Standing Shoulder Row with Anchored Resistance  - 1 x daily - 7 x weekly - 3 sets - 10 reps - red band hold - Standing Isometric Shoulder External Rotation with Doorway  - 1 x daily - 7 x weekly - 2 sets - 10 reps - 5 sec hold - Supine Shoulder Flexion Extension Full Range AROM  - 1 x daily - 7 x weekly - 2-3 sets - 10 reps  ASSESSMENT:  CLINICAL IMPRESSION: Pt was able to complete all prescribed exercises with no adverse effect despite pain with movement. Exercises today focused on improving UE functional ability with OH reaching and improving dynamic stabilizer stretch. HEP updated for continued flexion progression. PT will continue to progress as tolerated per POC.   EVAL: Patient is a 68 y.o. F who was seen today for physical therapy evaluation and treatment for chronic L shoulder pain. Physical findings are consistent with MD impression as pt demonstrates decrease in L shoulder strength and AROM. Quick DASH score indicates severe disability for performance of home ADLs and community activities. Pt would benefit from skilled PT services working on improving strength and ROM in order to decrease pain and improve shoulder function.     OBJECTIVE IMPAIRMENTS: decreased activity tolerance, decreased mobility, difficulty walking, decreased ROM, decreased strength, impaired sensation, impaired UE functional use, postural dysfunction, and pain  ACTIVITY LIMITATIONS: carrying, lifting, standing, squatting, stairs, reach over head, and hygiene/grooming  PARTICIPATION LIMITATIONS: meal prep, cleaning, driving, shopping, community activity, yard work, and school  PERSONAL FACTORS: Time since onset of injury/illness/exacerbation and 1-2 comorbidities: Cervical and lumbar surgeries are  also affecting patient's functional outcome.   REHAB POTENTIAL: Good - may be limited due to chronicity of shoulder pain  CLINICAL DECISION MAKING: Evolving/moderate complexity  EVALUATION COMPLEXITY: Moderate   GOALS: Goals reviewed with patient? No  SHORT TERM GOALS: Target date: 12/17/2023   Pt will be compliant and knowledgeable with initial HEP for improved comfort and carryover Baseline: initial HEP given  Goal status: INITIAL  2.  Pt will self report left shoulder pain no greater than 7/10 for improved comfort and functional ability Baseline: 10/10 at worst Goal status: INITIAL   LONG TERM GOALS: Target date: 01/21/2024   Pt will decrease Quick DASH disability score to no greater than 65% as proxy for functional improvement Baseline: 82%  disability  Goal status: INITIAL  2.  Pt will self report left shoulder pain no greater than 3/10 for improved comfort and functional ability Baseline: 10/10 at worst Goal status: INITIAL   3.  Pt will improve L shoulder flexion to no less than 130 degrees for improved functional ability with ADL performance and OH reaching Baseline: 60 degrees Goal status: INITIAL  4.  Pt will improve L shoulder MMT to no less than 3+/5 for all tested motions for improved comfort and dynamic stabliity Baseline:  Goal status: INITIAL  5.  Pt will be able to lift 25lb KB with no increase in L shoulder pain for improved ability to lift her nephew Baseline: unable Goal status: INITIAL  PLAN:  PT FREQUENCY: 2x/week  PT DURATION: 8 weeks  PLANNED INTERVENTIONS: 97164- PT Re-evaluation, 97110-Therapeutic exercises, 97530- Therapeutic activity, [id number]- Neuromuscular re-education, 97535- Self Care, [id number]- Manual therapy, [id number]- Gait training, [id number]- Electrical stimulation (unattended), [id number]- Electrical stimulation (manual), 97016- Vasopneumatic device, Dry Needling, Cryotherapy, and Moist heat  PLAN FOR NEXT SESSION: assess HEP response, RTC and  periscapular strengthening, supine shoulder flexion progression  Referring diagnosis? M19.012 (ICD-10-CM) - Arthritis of left glenohumeral joint  Treatment diagnosis? (if different than referring diagnosis)  Chronic left shoulder pain Muscle weakness (generalized) Abnormal posture Cervicalgia  What was this (referring dx) caused by? []  Surgery []  Fall []  Ongoing issue [x]  Arthritis []  Other: ____________  Laterality: []  Rt [x]  Lt []  Both  Check all possible CPT codes:  *CHOOSE 10 OR LESS*    See Planned Interventions listed in the Plan section of the Evaluation.    Ivor Mars, PT 12/12/2023, 9:29 AM

## 2023-12-17 ENCOUNTER — Encounter: Payer: Self-pay | Admitting: Physical Therapy

## 2023-12-17 ENCOUNTER — Ambulatory Visit: Admitting: Physical Therapy

## 2023-12-17 DIAGNOSIS — G8929 Other chronic pain: Secondary | ICD-10-CM

## 2023-12-17 DIAGNOSIS — M25512 Pain in left shoulder: Secondary | ICD-10-CM | POA: Diagnosis not present

## 2023-12-17 DIAGNOSIS — M6281 Muscle weakness (generalized): Secondary | ICD-10-CM

## 2023-12-17 NOTE — Therapy (Signed)
 OUTPATIENT PHYSICAL THERAPY TREATMENT   Patient Name: Tina Bartlett MRN: [id number] DOB:09-Sep-1955, 68 y.o., female Today's Date: 12/17/2023  END OF SESSION:  PT End of Session - 12/17/23 1400     Visit Number 3    Number of Visits 17    Date for PT Re-Evaluation 01/21/24    Authorization Type [employer] MCR    PT Start Time 0200    PT Stop Time 0223   pt request to leave early   PT Time Calculation (min) 23 min              Past Medical History:  Diagnosis Date   Thyroid  disease    Past Surgical History:  Procedure Laterality Date   APPENDECTOMY     68 years old   CERVICAL FUSION  2020   C5-6 ACDF   JOINT REPLACEMENT Bilateral    Hips   LUMBAR SPINE SURGERY  2020   [facility] [place]   THYROIDECTOMY     Patient Active Problem List   Diagnosis Date Noted   Acute encephalopathy 07/30/2023   Overdose 07/30/2023   Daytime somnolence 11/27/2021   Dream enactment behavior 11/27/2021   Obstructive sleep apnea syndrome 11/27/2021   Acquired hypothyroidism 11/27/2021   History of lumbar laminectomy for spinal cord decompression 06/04/2021   Cervical pseudoarthrosis (HCC) 03/20/2021   Vasomotor symptoms due to menopause 08/16/2019   Hypothyroidism 08/10/2019   Hyperlipidemia 08/10/2019    PCP: Nils Baseman, NP  REFERRING PROVIDER: Jasmine Mesi, MD  REFERRING DIAG: [phone] (ICD-10-CM) - Arthritis of left glenohumeral joint   THERAPY DIAG:  Chronic left shoulder pain  Muscle weakness (generalized)  Rationale for Evaluation and Treatment: Rehabilitation  ONSET DATE: Chronic  SUBJECTIVE:                                                                                                                                                                                      SUBJECTIVE STATEMENT: Pt presents to PT with reports of continued L shoulder pain that is less in intensity since starting PT.   EVAL: Pt presents to with reports of chronic L  shoulder pain and discomfort. Denies trauma or MOI, notes that her R shoulder used to bother her but this got better while her L shoulder has been getting worse. Previous cervical and lumbar fusions and notes very frequent N/T and burning sensation down bilateral UE, L>R. Denies bowel/bladder changes or saddle anesthesia. She was scheduled for a L reverse TSA but she wants to try therapy first and hold off on surgery if she can. She occasionally cares for her nephew and is unable to lift him without shoulder  pain right now.  Hand dominance: Right  PERTINENT HISTORY: Cervical and lumbar surgeries  PAIN:  Are you having pain?  Yes: NPRS scale: 7/10 Worst: 10/10 Pain location: L shoulder, L UE Pain description: sharp, burning, N/T Aggravating factors: sweeping, vacuuming, reaching OH Relieving factors: gabapentin , icy-hot  PRECAUTIONS: None  RED FLAGS: None   WEIGHT BEARING RESTRICTIONS: No  FALLS:  Has patient fallen in last 6 months? Yes. Number of falls: one fall due to dizziness  LIVING ENVIRONMENT: Lives with: lives with their family Lives in: House/apartment Stairs: one flight of stairs Has following equipment at home: None  OCCUPATION: Retired  PLOF: Independent  PATIENT GOALS: pt wants to decrease L shoulder pain in order to be able to reach overhead and lift her nephew - wants to prevent surgery if possible  NEXT MD VISIT: Not scheduled   OBJECTIVE:  Note: Objective measures were completed at Evaluation unless otherwise noted.  DIAGNOSTIC FINDINGS:  See imaging   PATIENT SURVEYS:  Quick DASH: 82 % disability  COGNITION: Overall cognitive status: Within functional limits for tasks assessed     SENSATION: Light touch: Impaired  C5-C6 L dermatome  POSTURE: Rounded shoulders, fwd head  UPPER EXTREMITY ROM:   Active ROM Right eval Left eval Left  12/17/23  Shoulder flexion WFL 60 135 pain with lowering  Shoulder extension     Shoulder abduction WFL  50 120  Shoulder adduction     Shoulder internal rotation     Shoulder external rotation WFL 25   Elbow flexion     Elbow extension     Wrist flexion     Wrist extension     Wrist ulnar deviation     Wrist radial deviation     Wrist pronation     Wrist supination     (Blank rows = not tested)  UPPER EXTREMITY MMT:  MMT Right eval Left eval  Shoulder flexion 4 2+  Shoulder extension    Shoulder abduction 4 2+  Shoulder adduction    Shoulder internal rotation    Shoulder external rotation 4 3  Middle trapezius    Lower trapezius    Elbow flexion    Elbow extension    Wrist flexion    Wrist extension    Wrist ulnar deviation    Wrist radial deviation    Wrist pronation    Wrist supination    Grip strength (lbs) 45lbs 40lbs  (Blank rows = not tested)  SHOULDER SPECIAL TESTS: DNT  JOINT MOBILITY TESTING:  L GH hypomobility   PALPATION:  TTP L infraspinatus                                                                                                                      TREATMENT: [facility] Adult PT Treatment:  DATE: 12/17/23  Therapeutic Activity: Wall slides shoulder flexion Wall slides shoulder abduction Seated bilat Shoulder ER YTB 10 x 2  Seated scap retract Standing row RTB x 10 Supine shoulder flexion x 10 Side lying shoulder abduction x 10 -disc. Due to radicular sx Side lying ER AROM x 20 Updated HEP    [facility] Adult PT Treatment:                                                DATE: 12/12/2023 UBE lvl 1.0 x 3 min for functional activity tolerance Supine shoulder flexion 2x10 L  Supine bilateral ER 2x10 RTB Supine dow flexion 2x10  Seated row 2x10 RTB L shoulder ER isometric x 5 - 5" hold Manual Therapy:  STM to L upper trap Positional release to L upper trap Suboccipital release  [facility] Adult PT Treatment:                                                DATE: 11/26/2023 Therapeutic Exercise: Seated  scapular retraction x 5 - 5" hold Seated row x 5 RTB L shoulder ER isometric x 5 - 5" hold  PATIENT EDUCATION: Education details: HEP update Person educated: Patient Education method: Explanation, Demonstration, and Handouts Education comprehension: verbalized understanding and returned demonstration  HOME EXERCISE PROGRAM: Access Code: [email or web address] URL: [email or web address] Date: 12/12/2023 Prepared by: Loral Roch  Exercises - Seated Scapular Retraction  - 1 x daily - 7 x weekly - 2 sets - 10 reps - 3 sec hold - Standing Shoulder Row with Anchored Resistance  - 1 x daily - 7 x weekly - 3 sets - 10 reps - red band hold - Standing Isometric Shoulder External Rotation with Doorway  - 1 x daily - 7 x weekly - 2 sets - 10 reps - 5 sec hold - Supine Shoulder Flexion Extension Full Range AROM  - 1 x daily - 7 x weekly - 2-3 sets - 10 reps  ASSESSMENT:  CLINICAL IMPRESSION: Pt was able to complete all prescribed exercises with no adverse effect. Her AROM has improved, with pain on eccentric lowering from flexion. Able to progress to wall shoulder slides with good tolerance and updated HEP.  Began side lying shoulder abduction which increased her radicular sx. Reviewed how to hook theraband to door for shoulder rows. She is progressing. Short session due to pt request.   EVAL: Patient is a 68 y.o. F who was seen today for physical therapy evaluation and treatment for chronic L shoulder pain. Physical findings are consistent with MD impression as pt demonstrates decrease in L shoulder strength and AROM. Quick DASH score indicates severe disability for performance of home ADLs and community activities. Pt would benefit from skilled PT services working on improving strength and ROM in order to decrease pain and improve shoulder function.     OBJECTIVE IMPAIRMENTS: decreased activity tolerance, decreased mobility, difficulty walking, decreased ROM, decreased strength, impaired  sensation, impaired UE functional use, postural dysfunction, and pain  ACTIVITY LIMITATIONS: carrying, lifting, standing, squatting, stairs, reach over head, and hygiene/grooming  PARTICIPATION LIMITATIONS: meal prep, cleaning, driving, shopping, community activity, yard work, and school  PERSONAL FACTORS: Time since onset of injury/illness/exacerbation and 1-2 comorbidities: Cervical and lumbar  surgeries are also affecting patient's functional outcome.   REHAB POTENTIAL: Good - may be limited due to chronicity of shoulder pain  CLINICAL DECISION MAKING: Evolving/moderate complexity  EVALUATION COMPLEXITY: Moderate   GOALS: Goals reviewed with patient? No  SHORT TERM GOALS: Target date: 12/17/2023   Pt will be compliant and knowledgeable with initial HEP for improved comfort and carryover Baseline: initial HEP given  Goal status: INITIAL  2.  Pt will self report left shoulder pain no greater than 7/10 for improved comfort and functional ability Baseline: 10/10 at worst Goal status: INITIAL   LONG TERM GOALS: Target date: 01/21/2024   Pt will decrease Quick DASH disability score to no greater than 65% as proxy for functional improvement Baseline: 82% disability  Goal status: INITIAL  2.  Pt will self report left shoulder pain no greater than 3/10 for improved comfort and functional ability Baseline: 10/10 at worst Goal status: INITIAL   3.  Pt will improve L shoulder flexion to no less than 130 degrees for improved functional ability with ADL performance and OH reaching Baseline: 60 degrees Goal status: INITIAL  4.  Pt will improve L shoulder MMT to no less than 3+/5 for all tested motions for improved comfort and dynamic stabliity Baseline:  Goal status: INITIAL  5.  Pt will be able to lift 25lb KB with no increase in L shoulder pain for improved ability to lift her nephew Baseline: unable Goal status: INITIAL  PLAN:  PT FREQUENCY: 2x/week  PT DURATION: 8  weeks  PLANNED INTERVENTIONS: 97164- PT Re-evaluation, 97110-Therapeutic exercises, 97530- Therapeutic activity, [id number]- Neuromuscular re-education, 97535- Self Care, 16109- Manual therapy, [id number]- Gait training, U0454- Electrical stimulation (unattended), [id number]- Electrical stimulation (manual), 97016- Vasopneumatic device, Dry Needling, Cryotherapy, and Moist heat  PLAN FOR NEXT SESSION: assess HEP response, RTC and periscapular strengthening, supine shoulder flexion progression  Referring diagnosis? M19.012 (ICD-10-CM) - Arthritis of left glenohumeral joint  Treatment diagnosis? (if different than referring diagnosis)  Chronic left shoulder pain Muscle weakness (generalized) Abnormal posture Cervicalgia  What was this (referring dx) caused by? []  Surgery []  Fall []  Ongoing issue [x]  Arthritis []  Other: ____________  Laterality: []  Rt [x]  Lt []  Both  Check all possible CPT codes:  *CHOOSE 10 OR LESS*    See Planned Interventions listed in the Plan section of the Evaluation.    Gasper Karst, PTA 12/17/23 2:24 PM Phone: [phone] Fax: [phone]

## 2023-12-19 ENCOUNTER — Ambulatory Visit

## 2023-12-25 ENCOUNTER — Ambulatory Visit: Attending: Orthopedic Surgery

## 2023-12-25 DIAGNOSIS — M25512 Pain in left shoulder: Secondary | ICD-10-CM | POA: Insufficient documentation

## 2023-12-25 DIAGNOSIS — R293 Abnormal posture: Secondary | ICD-10-CM | POA: Insufficient documentation

## 2023-12-25 DIAGNOSIS — G8929 Other chronic pain: Secondary | ICD-10-CM | POA: Insufficient documentation

## 2023-12-25 DIAGNOSIS — M6281 Muscle weakness (generalized): Secondary | ICD-10-CM | POA: Insufficient documentation

## 2023-12-25 DIAGNOSIS — M542 Cervicalgia: Secondary | ICD-10-CM | POA: Insufficient documentation

## 2023-12-25 NOTE — Therapy (Signed)
 Patient arrived and stated as we were walking back to the gym that she was having too much lower back today to participate in therapy. Wanted to cancel today's session as well as session on 12/26/23 and see how she is feeling at there appointment next week.  Ivor Mars PT  12/25/23 4:24 PM

## 2023-12-26 ENCOUNTER — Ambulatory Visit: Admitting: Physical Therapy

## 2023-12-29 ENCOUNTER — Ambulatory Visit

## 2023-12-29 DIAGNOSIS — M542 Cervicalgia: Secondary | ICD-10-CM

## 2023-12-29 DIAGNOSIS — R293 Abnormal posture: Secondary | ICD-10-CM

## 2023-12-29 DIAGNOSIS — G8929 Other chronic pain: Secondary | ICD-10-CM | POA: Diagnosis present

## 2023-12-29 DIAGNOSIS — M6281 Muscle weakness (generalized): Secondary | ICD-10-CM | POA: Diagnosis present

## 2023-12-29 DIAGNOSIS — M25512 Pain in left shoulder: Secondary | ICD-10-CM | POA: Diagnosis present

## 2023-12-29 NOTE — Therapy (Signed)
 OUTPATIENT PHYSICAL THERAPY TREATMENT   Patient Name: Tina Bartlett MRN: [id number] DOB:06/30/1956, 68 y.o., female Today's Date: 12/29/2023  END OF SESSION:  PT End of Session - 12/29/23 1345     Visit Number 4    Number of Visits 17    Date for PT Re-Evaluation 01/21/24    Authorization Type [employer] MCR    Authorization Time Period Approved 12 visits 11/26/23-01/03/24    Authorization - Visit Number 3    Authorization - Number of Visits 12    PT Start Time 1350    PT Stop Time 1430    PT Time Calculation (min) 40 min               Past Medical History:  Diagnosis Date   Thyroid  disease    Past Surgical History:  Procedure Laterality Date   APPENDECTOMY     68 years old   CERVICAL FUSION  2020   C5-6 ACDF   JOINT REPLACEMENT Bilateral    Hips   LUMBAR SPINE SURGERY  2020   [facility] [place]   THYROIDECTOMY     Patient Active Problem List   Diagnosis Date Noted   Acute encephalopathy 07/30/2023   Overdose 07/30/2023   Daytime somnolence 11/27/2021   Dream enactment behavior 11/27/2021   Obstructive sleep apnea syndrome 11/27/2021   Acquired hypothyroidism 11/27/2021   History of lumbar laminectomy for spinal cord decompression 06/04/2021   Cervical pseudoarthrosis (HCC) 03/20/2021   Vasomotor symptoms due to menopause 08/16/2019   Hypothyroidism 08/10/2019   Hyperlipidemia 08/10/2019    PCP: Nils Baseman, NP  REFERRING PROVIDER: Jasmine Mesi, MD  REFERRING DIAG: [phone] (ICD-10-CM) - Arthritis of left glenohumeral joint   THERAPY DIAG:  Chronic left shoulder pain  Muscle weakness (generalized)  Abnormal posture  Cervicalgia  Rationale for Evaluation and Treatment: Rehabilitation  ONSET DATE: Chronic  SUBJECTIVE:                                                                                                                                                                                      SUBJECTIVE STATEMENT: Pt  presents to PT noting she is feeling better compared to previous session. Has been fairly compliant with HEP.   EVAL: Pt presents to with reports of chronic L shoulder pain and discomfort. Denies trauma or MOI, notes that her R shoulder used to bother her but this got better while her L shoulder has been getting worse. Previous cervical and lumbar fusions and notes very frequent N/T and burning sensation down bilateral UE, L>R. Denies bowel/bladder changes or saddle anesthesia. She was scheduled for a L reverse TSA but she  wants to try therapy first and hold off on surgery if she can. She occasionally cares for her nephew and is unable to lift him without shoulder pain right now.  Hand dominance: Right  PERTINENT HISTORY: Cervical and lumbar surgeries  PAIN:  Are you having pain?  Yes: NPRS scale: 7/10 Worst: 10/10 Pain location: L shoulder, L UE Pain description: sharp, burning, N/T Aggravating factors: sweeping, vacuuming, reaching OH Relieving factors: gabapentin , icy-hot  PRECAUTIONS: None  RED FLAGS: None   WEIGHT BEARING RESTRICTIONS: No  FALLS:  Has patient fallen in last 6 months? Yes. Number of falls: one fall due to dizziness  LIVING ENVIRONMENT: Lives with: lives with their family Lives in: House/apartment Stairs: one flight of stairs Has following equipment at home: None  OCCUPATION: Retired  PLOF: Independent  PATIENT GOALS: pt wants to decrease L shoulder pain in order to be able to reach overhead and lift her nephew - wants to prevent surgery if possible  NEXT MD VISIT: Not scheduled   OBJECTIVE:  Note: Objective measures were completed at Evaluation unless otherwise noted.  DIAGNOSTIC FINDINGS:  See imaging   PATIENT SURVEYS:  Quick DASH: 82 % disability  COGNITION: Overall cognitive status: Within functional limits for tasks assessed     SENSATION: Light touch: Impaired  C5-C6 L dermatome  POSTURE: Rounded shoulders, fwd head  UPPER  EXTREMITY ROM:   Active ROM Right eval Left eval Left  12/17/23  Shoulder flexion WFL 60 135 pain with lowering  Shoulder extension     Shoulder abduction WFL 50 120  Shoulder adduction     Shoulder internal rotation     Shoulder external rotation WFL 25   Elbow flexion     Elbow extension     Wrist flexion     Wrist extension     Wrist ulnar deviation     Wrist radial deviation     Wrist pronation     Wrist supination     (Blank rows = not tested)  UPPER EXTREMITY MMT:  MMT Right eval Left eval  Shoulder flexion 4 2+  Shoulder extension    Shoulder abduction 4 2+  Shoulder adduction    Shoulder internal rotation    Shoulder external rotation 4 3  Middle trapezius    Lower trapezius    Elbow flexion    Elbow extension    Wrist flexion    Wrist extension    Wrist ulnar deviation    Wrist radial deviation    Wrist pronation    Wrist supination    Grip strength (lbs) 45lbs 40lbs  (Blank rows = not tested)  SHOULDER SPECIAL TESTS: DNT  JOINT MOBILITY TESTING:  L GH hypomobility   PALPATION:  TTP L infraspinatus                                                                                                                      TREATMENT: [facility] Adult PT Treatment:  DATE: 12/29/2023 UBE lvl 1.0 x 3 min for functional activity tolerance Supine shoulder flexion 2x10 L 1# S/L shoulder abd 2x10 L S/L ER 2x10 L 1# Bilateral ER 2x10 RTB Seated horizontal abd 2x10 RTB Standing row 3x10 GTB Standing ext 2x10 GTB L shoulder ER 2x10 YTB Seated upper trap stretch x 30" L Supine pec stretch 90/90 2x30"  [facility] Adult PT Treatment:                                                DATE: 12/17/23 Therapeutic Activity: Wall slides shoulder flexion Wall slides shoulder abduction Seated bilat Shoulder ER YTB 10 x 2  Seated scap retract Standing row RTB x 10 Supine shoulder flexion x 10 Side lying shoulder abduction x 10 -disc.  Due to radicular sx Side lying ER AROM x 20 Updated HEP   [facility] Adult PT Treatment:                                                DATE: 12/12/2023 UBE lvl 1.0 x 3 min for functional activity tolerance Supine shoulder flexion 2x10 L  Supine bilateral ER 2x10 RTB Supine dow flexion 2x10  Seated row 2x10 RTB L shoulder ER isometric x 5 - 5" hold Manual Therapy:  STM to L upper trap Positional release to L upper trap Suboccipital release  [facility] Adult PT Treatment:                                                DATE: 11/26/2023 Therapeutic Exercise: Seated scapular retraction x 5 - 5" hold Seated row x 5 RTB L shoulder ER isometric x 5 - 5" hold  PATIENT EDUCATION: Education details: HEP update Person educated: Patient Education method: Explanation, Demonstration, and Handouts Education comprehension: verbalized understanding and returned demonstration  HOME EXERCISE PROGRAM: Access Code: [email or web address] URL: [email or web address] Date: 12/29/2023 Prepared by: Loral Roch  Exercises - Seated Scapular Retraction  - 1 x daily - 7 x weekly - 2 sets - 10 reps - 3 sec hold - Standing Shoulder Row with Anchored Resistance  - 1 x daily - 7 x weekly - 3 sets - 10 reps - red band hold - Standing Isometric Shoulder External Rotation with Doorway  - 1 x daily - 7 x weekly - 2 sets - 10 reps - 5 sec hold - Supine Shoulder Flexion Extension Full Range AROM  - 1 x daily - 7 x weekly - 2-3 sets - 10 reps - Shoulder Flexion Wall Slide with Towel  - 1 x daily - 7 x weekly - 2 sets - 10 reps - Standing Shoulder Abduction Wall Slide with Thumb Out  - 1 x daily - 7 x weekly - 2 sets - 10 reps - Standing Shoulder External Rotation with Resistance  - 1 x daily - 7 x weekly - 1-2 sets - 10 reps  ASSESSMENT:  CLINICAL IMPRESSION: Pt was able to complete all prescribed exercises with improved tolerance today. Exercises were progressed but continued to focus on L ER strengthening, general  functional ability of L shoulder, and L  periscapular strengthening in order to decrease pain. She continues to benefit from skilled PT services, will continue to progress as tolerated per POC.   EVAL: Patient is a 68 y.o. F who was seen today for physical therapy evaluation and treatment for chronic L shoulder pain. Physical findings are consistent with MD impression as pt demonstrates decrease in L shoulder strength and AROM. Quick DASH score indicates severe disability for performance of home ADLs and community activities. Pt would benefit from skilled PT services working on improving strength and ROM in order to decrease pain and improve shoulder function.     OBJECTIVE IMPAIRMENTS: decreased activity tolerance, decreased mobility, difficulty walking, decreased ROM, decreased strength, impaired sensation, impaired UE functional use, postural dysfunction, and pain  ACTIVITY LIMITATIONS: carrying, lifting, standing, squatting, stairs, reach over head, and hygiene/grooming  PARTICIPATION LIMITATIONS: meal prep, cleaning, driving, shopping, community activity, yard work, and school  PERSONAL FACTORS: Time since onset of injury/illness/exacerbation and 1-2 comorbidities: Cervical and lumbar surgeries are also affecting patient's functional outcome.   REHAB POTENTIAL: Good - may be limited due to chronicity of shoulder pain  CLINICAL DECISION MAKING: Evolving/moderate complexity  EVALUATION COMPLEXITY: Moderate   GOALS: Goals reviewed with patient? No  SHORT TERM GOALS: Target date: 12/17/2023   Pt will be compliant and knowledgeable with initial HEP for improved comfort and carryover Baseline: initial HEP given  Goal status: INITIAL  2.  Pt will self report left shoulder pain no greater than 7/10 for improved comfort and functional ability Baseline: 10/10 at worst Goal status: INITIAL   LONG TERM GOALS: Target date: 01/21/2024   Pt will decrease Quick DASH disability score to no greater  than 65% as proxy for functional improvement Baseline: 82% disability  Goal status: INITIAL  2.  Pt will self report left shoulder pain no greater than 3/10 for improved comfort and functional ability Baseline: 10/10 at worst Goal status: INITIAL   3.  Pt will improve L shoulder flexion to no less than 130 degrees for improved functional ability with ADL performance and OH reaching Baseline: 60 degrees Goal status: INITIAL  4.  Pt will improve L shoulder MMT to no less than 3+/5 for all tested motions for improved comfort and dynamic stabliity Baseline:  Goal status: INITIAL  5.  Pt will be able to lift 25lb KB with no increase in L shoulder pain for improved ability to lift her nephew Baseline: unable Goal status: INITIAL  PLAN:  PT FREQUENCY: 2x/week  PT DURATION: 8 weeks  PLANNED INTERVENTIONS: 97164- PT Re-evaluation, 97110-Therapeutic exercises, 97530- Therapeutic activity, [id number]- Neuromuscular re-education, 97535- Self Care, 16109- Manual therapy, [id number]- Gait training, U0454- Electrical stimulation (unattended), [id number]- Electrical stimulation (manual), 97016- Vasopneumatic device, Dry Needling, Cryotherapy, and Moist heat  PLAN FOR NEXT SESSION: assess HEP response, RTC and periscapular strengthening, supine shoulder flexion progression  Referring diagnosis? M19.012 (ICD-10-CM) - Arthritis of left glenohumeral joint  Treatment diagnosis? (if different than referring diagnosis)  Chronic left shoulder pain Muscle weakness (generalized) Abnormal posture Cervicalgia  What was this (referring dx) caused by? []  Surgery []  Fall []  Ongoing issue [x]  Arthritis []  Other: ____________  Laterality: []  Rt [x]  Lt []  Both  Check all possible CPT codes:  *CHOOSE 10 OR LESS*    See Planned Interventions listed in the Plan section of the Evaluation.    Ivor Mars PT  12/29/23 2:41 PM

## 2023-12-31 ENCOUNTER — Ambulatory Visit

## 2023-12-31 DIAGNOSIS — M542 Cervicalgia: Secondary | ICD-10-CM

## 2023-12-31 DIAGNOSIS — R293 Abnormal posture: Secondary | ICD-10-CM

## 2023-12-31 DIAGNOSIS — G8929 Other chronic pain: Secondary | ICD-10-CM

## 2023-12-31 DIAGNOSIS — M25512 Pain in left shoulder: Secondary | ICD-10-CM | POA: Diagnosis not present

## 2023-12-31 DIAGNOSIS — M6281 Muscle weakness (generalized): Secondary | ICD-10-CM

## 2023-12-31 NOTE — Therapy (Signed)
 OUTPATIENT PHYSICAL THERAPY TREATMENT   Patient Name: Tina Bartlett MRN: [id number] DOB:1956-04-06, 68 y.o., female Today's Date: 12/31/2023  END OF SESSION:  PT End of Session - 12/31/23 1350     Visit Number 5    Number of Visits 17    Date for PT Re-Evaluation 01/21/24    Authorization Type [employer] MCR    Authorization Time Period Approved 12 visits 11/26/23-01/03/24    Authorization - Visit Number 4    Authorization - Number of Visits 12    PT Start Time 1355    PT Stop Time 1433    PT Time Calculation (min) 38 min                Past Medical History:  Diagnosis Date   Thyroid  disease    Past Surgical History:  Procedure Laterality Date   APPENDECTOMY     68 years old   CERVICAL FUSION  2020   C5-6 ACDF   JOINT REPLACEMENT Bilateral    Hips   LUMBAR SPINE SURGERY  2020   [facility] [place]   THYROIDECTOMY     Patient Active Problem List   Diagnosis Date Noted   Acute encephalopathy 07/30/2023   Overdose 07/30/2023   Daytime somnolence 11/27/2021   Dream enactment behavior 11/27/2021   Obstructive sleep apnea syndrome 11/27/2021   Acquired hypothyroidism 11/27/2021   History of lumbar laminectomy for spinal cord decompression 06/04/2021   Cervical pseudoarthrosis (HCC) 03/20/2021   Vasomotor symptoms due to menopause 08/16/2019   Hypothyroidism 08/10/2019   Hyperlipidemia 08/10/2019    PCP: Nils Baseman, NP  REFERRING PROVIDER: Jasmine Mesi, MD  REFERRING DIAG: [phone] (ICD-10-CM) - Arthritis of left glenohumeral joint   THERAPY DIAG:  Chronic left shoulder pain  Muscle weakness (generalized)  Abnormal posture  Cervicalgia  Rationale for Evaluation and Treatment: Rehabilitation  ONSET DATE: Chronic  SUBJECTIVE:                                                                                                                                                                                      SUBJECTIVE  STATEMENT: Pt presents to PT with reports of soreness after last session and current pain. Has been compliant with HEP.   EVAL: Pt presents to with reports of chronic L shoulder pain and discomfort. Denies trauma or MOI, notes that her R shoulder used to bother her but this got better while her L shoulder has been getting worse. Previous cervical and lumbar fusions and notes very frequent N/T and burning sensation down bilateral UE, L>R. Denies bowel/bladder changes or saddle anesthesia. She was scheduled for a L reverse TSA but  she wants to try therapy first and hold off on surgery if she can. She occasionally cares for her nephew and is unable to lift him without shoulder pain right now.  Hand dominance: Right  PERTINENT HISTORY: Cervical and lumbar surgeries  PAIN:  Are you having pain?  Yes: NPRS scale: 7/10 Worst: 10/10 Pain location: L shoulder, L UE Pain description: sharp, burning, N/T Aggravating factors: sweeping, vacuuming, reaching OH Relieving factors: gabapentin , icy-hot  PRECAUTIONS: None  RED FLAGS: None   WEIGHT BEARING RESTRICTIONS: No  FALLS:  Has patient fallen in last 6 months? Yes. Number of falls: one fall due to dizziness  LIVING ENVIRONMENT: Lives with: lives with their family Lives in: House/apartment Stairs: one flight of stairs Has following equipment at home: None  OCCUPATION: Retired  PLOF: Independent  PATIENT GOALS: pt wants to decrease L shoulder pain in order to be able to reach overhead and lift her nephew - wants to prevent surgery if possible  NEXT MD VISIT: Not scheduled   OBJECTIVE:  Note: Objective measures were completed at Evaluation unless otherwise noted.  DIAGNOSTIC FINDINGS:  See imaging   PATIENT SURVEYS:  Quick DASH: 82 % disability  COGNITION: Overall cognitive status: Within functional limits for tasks assessed     SENSATION: Light touch: Impaired  C5-C6 L dermatome  POSTURE: Rounded shoulders, fwd  head  UPPER EXTREMITY ROM:   Active ROM Right eval Left eval Left  12/17/23  Shoulder flexion WFL 60 135 pain with lowering  Shoulder extension     Shoulder abduction WFL 50 120  Shoulder adduction     Shoulder internal rotation     Shoulder external rotation WFL 25   Elbow flexion     Elbow extension     Wrist flexion     Wrist extension     Wrist ulnar deviation     Wrist radial deviation     Wrist pronation     Wrist supination     (Blank rows = not tested)  UPPER EXTREMITY MMT:  MMT Right eval Left eval  Shoulder flexion 4 2+  Shoulder extension    Shoulder abduction 4 2+  Shoulder adduction    Shoulder internal rotation    Shoulder external rotation 4 3  Middle trapezius    Lower trapezius    Elbow flexion    Elbow extension    Wrist flexion    Wrist extension    Wrist ulnar deviation    Wrist radial deviation    Wrist pronation    Wrist supination    Grip strength (lbs) 45lbs 40lbs  (Blank rows = not tested)  SHOULDER SPECIAL TESTS: DNT  JOINT MOBILITY TESTING:  L GH hypomobility   PALPATION:  TTP L infraspinatus                                                                                                                      TREATMENT: [facility] Adult PT Treatment:  DATE: 12/31/2023 UBE lvl 1.0 x 3 min for functional activity tolerance Supine shoulder flexion 2x10 L Supine horizontal abd 2x10 YTB Supine bilateral ER 2x10 YTB Supine dow flex x 15 Supine chest press 2x10 Standing row 3x10 GTB L shoulder ER 2x10 YTB  [facility] Adult PT Treatment:                                                DATE: 12/29/2023 UBE lvl 1.0 x 3 min for functional activity tolerance Supine shoulder flexion 2x10 L 1# S/L shoulder abd 2x10 L S/L ER 2x10 L 1# Bilateral ER 2x10 RTB Seated horizontal abd 2x10 RTB Standing row 3x10 GTB Standing ext 2x10 GTB L shoulder ER 2x10 YTB Seated upper trap stretch x 30 L Supine pec  stretch 90/90 2x30  [facility] Adult PT Treatment:                                                DATE: 12/17/23 Therapeutic Activity: Wall slides shoulder flexion Wall slides shoulder abduction Seated bilat Shoulder ER YTB 10 x 2  Seated scap retract Standing row RTB x 10 Supine shoulder flexion x 10 Side lying shoulder abduction x 10 -disc. Due to radicular sx Side lying ER AROM x 20 Updated HEP   [facility] Adult PT Treatment:                                                DATE: 12/12/2023 UBE lvl 1.0 x 3 min for functional activity tolerance Supine shoulder flexion 2x10 L  Supine bilateral ER 2x10 RTB Supine dow flexion 2x10  Seated row 2x10 RTB L shoulder ER isometric x 5 - 5 hold Manual Therapy:  STM to L upper trap Positional release to L upper trap Suboccipital release  [facility] Adult PT Treatment:                                                DATE: 11/26/2023 Therapeutic Exercise: Seated scapular retraction x 5 - 5 hold Seated row x 5 RTB L shoulder ER isometric x 5 - 5 hold  PATIENT EDUCATION: Education details: HEP update Person educated: Patient Education method: Explanation, Demonstration, and Handouts Education comprehension: verbalized understanding and returned demonstration  HOME EXERCISE PROGRAM: Access Code: [email or web address] URL: [email or web address] Date: 12/29/2023 Prepared by: Loral Roch  Exercises - Seated Scapular Retraction  - 1 x daily - 7 x weekly - 2 sets - 10 reps - 3 sec hold - Standing Shoulder Row with Anchored Resistance  - 1 x daily - 7 x weekly - 3 sets - 10 reps - red band hold - Standing Isometric Shoulder External Rotation with Doorway  - 1 x daily - 7 x weekly - 2 sets - 10 reps - 5 sec hold - Supine Shoulder Flexion Extension Full Range AROM  - 1 x daily - 7 x weekly - 2-3 sets - 10 reps - Shoulder Flexion Wall Slide with Towel  -  1 x daily - 7 x weekly - 2 sets - 10 reps - Standing Shoulder Abduction Wall Slide with Thumb Out  - 1 x  daily - 7 x weekly - 2 sets - 10 reps - Standing Shoulder External Rotation with Resistance  - 1 x daily - 7 x weekly - 1-2 sets - 10 reps  ASSESSMENT:  CLINICAL IMPRESSION: Pt was able to complete all prescribed exercises with continued progression of functional ROM and RTC strength. We also focused on improving reach and lifting tasks. Pt continues to demo weakness and decreased function of L shoulder. PT should continue to progress flexion based activities and strengthening as tolerated.   EVAL: Patient is a 68 y.o. F who was seen today for physical therapy evaluation and treatment for chronic L shoulder pain. Physical findings are consistent with MD impression as pt demonstrates decrease in L shoulder strength and AROM. Quick DASH score indicates severe disability for performance of home ADLs and community activities. Pt would benefit from skilled PT services working on improving strength and ROM in order to decrease pain and improve shoulder function.     OBJECTIVE IMPAIRMENTS: decreased activity tolerance, decreased mobility, difficulty walking, decreased ROM, decreased strength, impaired sensation, impaired UE functional use, postural dysfunction, and pain  ACTIVITY LIMITATIONS: carrying, lifting, standing, squatting, stairs, reach over head, and hygiene/grooming  PARTICIPATION LIMITATIONS: meal prep, cleaning, driving, shopping, community activity, yard work, and school  PERSONAL FACTORS: Time since onset of injury/illness/exacerbation and 1-2 comorbidities: Cervical and lumbar surgeries are also affecting patient's functional outcome.   REHAB POTENTIAL: Good - may be limited due to chronicity of shoulder pain  CLINICAL DECISION MAKING: Evolving/moderate complexity  EVALUATION COMPLEXITY: Moderate   GOALS: Goals reviewed with patient? No  SHORT TERM GOALS: Target date: 12/17/2023   Pt will be compliant and knowledgeable with initial HEP for improved comfort and  carryover Baseline: initial HEP given  Goal status: INITIAL  2.  Pt will self report left shoulder pain no greater than 7/10 for improved comfort and functional ability Baseline: 10/10 at worst Goal status: INITIAL   LONG TERM GOALS: Target date: 01/21/2024   Pt will decrease Quick DASH disability score to no greater than 65% as proxy for functional improvement Baseline: 82% disability  Goal status: INITIAL  2.  Pt will self report left shoulder pain no greater than 3/10 for improved comfort and functional ability Baseline: 10/10 at worst Goal status: INITIAL   3.  Pt will improve L shoulder flexion to no less than 130 degrees for improved functional ability with ADL performance and OH reaching Baseline: 60 degrees Goal status: INITIAL  4.  Pt will improve L shoulder MMT to no less than 3+/5 for all tested motions for improved comfort and dynamic stabliity Baseline:  Goal status: INITIAL  5.  Pt will be able to lift 25lb KB with no increase in L shoulder pain for improved ability to lift her nephew Baseline: unable Goal status: INITIAL  PLAN:  PT FREQUENCY: 2x/week  PT DURATION: 8 weeks  PLANNED INTERVENTIONS: 97164- PT Re-evaluation, 97110-Therapeutic exercises, 97530- Therapeutic activity, [id number]- Neuromuscular re-education, 97535- Self Care, [id number]- Manual therapy, [id number]- Gait training, [id number]- Electrical stimulation (unattended), [id number]- Electrical stimulation (manual), 97016- Vasopneumatic device, Dry Needling, Cryotherapy, and Moist heat  PLAN FOR NEXT SESSION: assess HEP response, RTC and periscapular strengthening, supine shoulder flexion progression  Referring diagnosis? M19.012 (ICD-10-CM) - Arthritis of left glenohumeral joint  Treatment diagnosis? (if different than referring diagnosis)  Chronic left shoulder  pain Muscle weakness (generalized) Abnormal posture Cervicalgia  What was this (referring dx) caused by? []  Surgery []  Fall []  Ongoing issue [x]   Arthritis []  Other: ____________  Laterality: []  Rt [x]  Lt []  Both  Check all possible CPT codes:  *CHOOSE 10 OR LESS*    See Planned Interventions listed in the Plan section of the Evaluation.    Ivor Mars PT  12/31/23 2:34 PM

## 2024-01-09 ENCOUNTER — Encounter: Payer: Self-pay | Admitting: Physical Therapy

## 2024-01-09 ENCOUNTER — Ambulatory Visit: Admitting: Physical Therapy

## 2024-01-09 DIAGNOSIS — R293 Abnormal posture: Secondary | ICD-10-CM

## 2024-01-09 DIAGNOSIS — G8929 Other chronic pain: Secondary | ICD-10-CM

## 2024-01-09 DIAGNOSIS — M6281 Muscle weakness (generalized): Secondary | ICD-10-CM

## 2024-01-09 DIAGNOSIS — M25512 Pain in left shoulder: Secondary | ICD-10-CM | POA: Diagnosis not present

## 2024-01-09 NOTE — Therapy (Signed)
 OUTPATIENT PHYSICAL THERAPY TREATMENT   Patient Name: Tina Bartlett MRN: [id number] DOB:04/30/1956, 68 y.o., female Today's Date: 01/09/2024  END OF SESSION:  PT End of Session - 01/09/24 1058     Visit Number 6    Number of Visits 17    Date for PT Re-Evaluation 01/21/24    Authorization Type [employer] MCR    Authorization Time Period Approved 12 visits 11/26/23-01/03/24    PT Start Time 1038    PT Stop Time 1101    PT Time Calculation (min) 23 min              Past Medical History:  Diagnosis Date   Thyroid  disease    Past Surgical History:  Procedure Laterality Date   APPENDECTOMY     68 years old   CERVICAL FUSION  2020   C5-6 ACDF   JOINT REPLACEMENT Bilateral    Hips   LUMBAR SPINE SURGERY  2020   [facility] [place]   THYROIDECTOMY     Patient Active Problem List   Diagnosis Date Noted   Acute encephalopathy 07/30/2023   Overdose 07/30/2023   Daytime somnolence 11/27/2021   Dream enactment behavior 11/27/2021   Obstructive sleep apnea syndrome 11/27/2021   Acquired hypothyroidism 11/27/2021   History of lumbar laminectomy for spinal cord decompression 06/04/2021   Cervical pseudoarthrosis (HCC) 03/20/2021   Vasomotor symptoms due to menopause 08/16/2019   Hypothyroidism 08/10/2019   Hyperlipidemia 08/10/2019    PCP: Nils Baseman, NP  REFERRING PROVIDER: Jasmine Mesi, MD  REFERRING DIAG: [phone] (ICD-10-CM) - Arthritis of left glenohumeral joint   THERAPY DIAG:  Chronic left shoulder pain  Muscle weakness (generalized)  Abnormal posture  Rationale for Evaluation and Treatment: Rehabilitation  ONSET DATE: Chronic  SUBJECTIVE:                                                                                                                                                                                      SUBJECTIVE STATEMENT: Pt reports she is seeing a lot of improvement in her shoulders.   EVAL: Pt presents to with  reports of chronic L shoulder pain and discomfort. Denies trauma or MOI, notes that her R shoulder used to bother her but this got better while her L shoulder has been getting worse. Previous cervical and lumbar fusions and notes very frequent N/T and burning sensation down bilateral UE, L>R. Denies bowel/bladder changes or saddle anesthesia. She was scheduled for a L reverse TSA but she wants to try therapy first and hold off on surgery if she can. She occasionally cares for her nephew and is unable to lift him without shoulder  pain right now.  Hand dominance: Right  PERTINENT HISTORY: Cervical and lumbar surgeries  PAIN:  Are you having pain?  Yes: NPRS scale: 7/10 Worst: 10/10 Pain location: L shoulder, L UE Pain description: sharp, burning, N/T Aggravating factors: sweeping, vacuuming, reaching OH Relieving factors: gabapentin , icy-hot  PRECAUTIONS: None  RED FLAGS: None   WEIGHT BEARING RESTRICTIONS: No  FALLS:  Has patient fallen in last 6 months? Yes. Number of falls: one fall due to dizziness  LIVING ENVIRONMENT: Lives with: lives with their family Lives in: House/apartment Stairs: one flight of stairs Has following equipment at home: None  OCCUPATION: Retired  PLOF: Independent  PATIENT GOALS: pt wants to decrease L shoulder pain in order to be able to reach overhead and lift her nephew - wants to prevent surgery if possible  NEXT MD VISIT: Not scheduled   OBJECTIVE:  Note: Objective measures were completed at Evaluation unless otherwise noted.  DIAGNOSTIC FINDINGS:  See imaging   PATIENT SURVEYS:  Quick DASH: 82 % disability  COGNITION: Overall cognitive status: Within functional limits for tasks assessed     SENSATION: Light touch: Impaired  C5-C6 L dermatome  POSTURE: Rounded shoulders, fwd head  UPPER EXTREMITY ROM:   Active ROM Right eval Left eval Left  12/17/23 Left 01/09/24  Shoulder flexion WFL 60 135 pain with lowering 148  Shoulder  extension      Shoulder abduction WFL 50 120 135  Shoulder adduction      Shoulder internal rotation      Shoulder external rotation WFL 25    Elbow flexion      Elbow extension      Wrist flexion      Wrist extension      Wrist ulnar deviation      Wrist radial deviation      Wrist pronation      Wrist supination      (Blank rows = not tested)  UPPER EXTREMITY MMT:  MMT Right eval Left eval  Shoulder flexion 4 2+  Shoulder extension    Shoulder abduction 4 2+  Shoulder adduction    Shoulder internal rotation    Shoulder external rotation 4 3  Middle trapezius    Lower trapezius    Elbow flexion    Elbow extension    Wrist flexion    Wrist extension    Wrist ulnar deviation    Wrist radial deviation    Wrist pronation    Wrist supination    Grip strength (lbs) 45lbs 40lbs  (Blank rows = not tested)  SHOULDER SPECIAL TESTS: DNT  JOINT MOBILITY TESTING:  L GH hypomobility   PALPATION:  TTP L infraspinatus                                                                                                                      TREATMENT: [facility] Adult PT Treatment:  DATE: 01/09/2024 UBE lvl 1.0 x 2 min forward, 1 min reverse  for functional activity tolerance Supine shoulder flexion 2x10 L Supine horizontal abd 2x10 YTB Supine bilateral ER 2x10 YTB Supine dow flex x 15 Supine chest press 2x10 Standing row 2x10 GTB Standing ext red 2 x 10  L shoulder ER 2x10 YTB Side lying shoulder abduction  Cabinet reach lower cabinet 2 x 10     [facility] Adult PT Treatment:                                                DATE: 12/31/2023 UBE lvl 1.0 x 3 min for functional activity tolerance Supine shoulder flexion 2x10 L Supine horizontal abd 2x10 YTB Supine bilateral ER 2x10 YTB Supine dow flex x 15 Supine chest press 2x10 Standing row 3x10 GTB L shoulder ER 2x10 YTB  [facility] Adult PT Treatment:                                                 DATE: 12/29/2023 UBE lvl 1.0 x 3 min for functional activity tolerance Supine shoulder flexion 2x10 L 1# S/L shoulder abd 2x10 L S/L ER 2x10 L 1# Bilateral ER 2x10 RTB Seated horizontal abd 2x10 RTB Standing row 3x10 GTB Standing ext 2x10 GTB L shoulder ER 2x10 YTB Seated upper trap stretch x 30 L Supine pec stretch 90/90 2x30  [facility] Adult PT Treatment:                                                DATE: 12/17/23 Therapeutic Activity: Wall slides shoulder flexion Wall slides shoulder abduction Seated bilat Shoulder ER YTB 10 x 2  Seated scap retract Standing row RTB x 10 Supine shoulder flexion x 10 Side lying shoulder abduction x 10 -disc. Due to radicular sx Side lying ER AROM x 20 Updated HEP   [facility] Adult PT Treatment:                                                DATE: 12/12/2023 UBE lvl 1.0 x 3 min for functional activity tolerance Supine shoulder flexion 2x10 L  Supine bilateral ER 2x10 RTB Supine dow flexion 2x10  Seated row 2x10 RTB L shoulder ER isometric x 5 - 5 hold Manual Therapy:  STM to L upper trap Positional release to L upper trap Suboccipital release  [facility] Adult PT Treatment:                                                DATE: 11/26/2023 Therapeutic Exercise: Seated scapular retraction x 5 - 5 hold Seated row x 5 RTB L shoulder ER isometric x 5 - 5 hold  PATIENT EDUCATION: Education details: HEP update Person educated: Patient Education method: Explanation, Demonstration, and Handouts Education comprehension: verbalized understanding and returned demonstration  HOME EXERCISE PROGRAM: Access Code: [email or web address] URL: [email or web address] Date: 12/29/2023 Prepared by: Loral Roch  Exercises - Seated Scapular Retraction  - 1 x daily - 7 x weekly - 2 sets - 10 reps - 3 sec hold - Standing Shoulder Row with Anchored Resistance  - 1 x daily - 7 x weekly - 3 sets - 10 reps - red band hold - Standing Isometric Shoulder External  Rotation with Doorway  - 1 x daily - 7 x weekly - 2 sets - 10 reps - 5 sec hold - Supine Shoulder Flexion Extension Full Range AROM  - 1 x daily - 7 x weekly - 2-3 sets - 10 reps - Shoulder Flexion Wall Slide with Towel  - 1 x daily - 7 x weekly - 2 sets - 10 reps - Standing Shoulder Abduction Wall Slide with Thumb Out  - 1 x daily - 7 x weekly - 2 sets - 10 reps - Standing Shoulder External Rotation with Resistance  - 1 x daily - 7 x weekly - 1-2 sets - 10 reps  ASSESSMENT:  CLINICAL IMPRESSION: Pt was able to complete all prescribed exercises with continued progression of functional ROM and RTC strength. We also focused on improving reach and lifting tasks. Pt continues to demo weakness and decreased function of L shoulder. AROM is improved for overhead reaching and pt reports decreased intensity if pain. She has met her STGs and making progress to remaining LTGs. PT should continue to progress flexion based activities and strengthening as tolerated.   EVAL: Patient is a 68 y.o. F who was seen today for physical therapy evaluation and treatment for chronic L shoulder pain. Physical findings are consistent with MD impression as pt demonstrates decrease in L shoulder strength and AROM. Quick DASH score indicates severe disability for performance of home ADLs and community activities. Pt would benefit from skilled PT services working on improving strength and ROM in order to decrease pain and improve shoulder function.     OBJECTIVE IMPAIRMENTS: decreased activity tolerance, decreased mobility, difficulty walking, decreased ROM, decreased strength, impaired sensation, impaired UE functional use, postural dysfunction, and pain  ACTIVITY LIMITATIONS: carrying, lifting, standing, squatting, stairs, reach over head, and hygiene/grooming  PARTICIPATION LIMITATIONS: meal prep, cleaning, driving, shopping, community activity, yard work, and school  PERSONAL FACTORS: Time since onset of  injury/illness/exacerbation and 1-2 comorbidities: Cervical and lumbar surgeries are also affecting patient's functional outcome.   REHAB POTENTIAL: Good - may be limited due to chronicity of shoulder pain  CLINICAL DECISION MAKING: Evolving/moderate complexity  EVALUATION COMPLEXITY: Moderate   GOALS: Goals reviewed with patient? No  SHORT TERM GOALS: Target date: 12/17/2023   Pt will be compliant and knowledgeable with initial HEP for improved comfort and carryover Baseline: initial HEP given  Goal status: MET  2.  Pt will self report left shoulder pain no greater than 7/10 for improved comfort and functional ability Baseline: 10/10 at worst Goal status: MET   LONG TERM GOALS: Target date: 01/21/2024   Pt will decrease Quick DASH disability score to no greater than 65% as proxy for functional improvement Baseline: 82% disability  Goal status: ONGOING  2.  Pt will self report left shoulder pain no greater than 3/10 for improved comfort and functional ability Baseline: 10/10 at worst Goal status: ONGOING   3.  Pt will improve L shoulder flexion to no less than 130 degrees for improved functional ability with ADL performance and OH reaching Baseline: 60 degrees Goal status: MET  4.  Pt will improve L shoulder MMT to no less than 3+/5 for all tested motions for improved comfort and dynamic stabliity Baseline:  Goal status: ONGOING  5.  Pt will be able to lift 25lb KB with no increase in L shoulder pain for improved ability to lift her nephew Baseline: unable Goal status: ONGOING  PLAN:  PT FREQUENCY: 2x/week  PT DURATION: 8 weeks  PLANNED INTERVENTIONS: 97164- PT Re-evaluation, 97110-Therapeutic exercises, 97530- Therapeutic activity, [id number]- Neuromuscular re-education, 97535- Self Care, 16109- Manual therapy, [id number]- Gait training, U0454- Electrical stimulation (unattended), [id number]- Electrical stimulation (manual), 97016- Vasopneumatic device, Dry Needling, Cryotherapy, and  Moist heat  PLAN FOR NEXT SESSION: assess HEP response, RTC and periscapular strengthening, supine shoulder flexion progression  Referring diagnosis? M19.012 (ICD-10-CM) - Arthritis of left glenohumeral joint  Treatment diagnosis? (if different than referring diagnosis)  Chronic left shoulder pain Muscle weakness (generalized) Abnormal posture Cervicalgia  What was this (referring dx) caused by? []  Surgery []  Fall []  Ongoing issue [x]  Arthritis []  Other: ____________  Laterality: []  Rt [x]  Lt []  Both  Check all possible CPT codes:  *CHOOSE 10 OR LESS*    See Planned Interventions listed in the Plan section of the Evaluation.    Gasper Karst, PTA 01/09/24 10:59 AM Phone: [phone] Fax: [phone]

## 2024-01-12 ENCOUNTER — Ambulatory Visit

## 2024-01-16 ENCOUNTER — Encounter: Payer: Self-pay | Admitting: Physical Therapy

## 2024-01-16 ENCOUNTER — Ambulatory Visit: Admitting: Physical Therapy

## 2024-01-16 DIAGNOSIS — M6281 Muscle weakness (generalized): Secondary | ICD-10-CM

## 2024-01-16 DIAGNOSIS — M25512 Pain in left shoulder: Secondary | ICD-10-CM | POA: Diagnosis not present

## 2024-01-16 DIAGNOSIS — G8929 Other chronic pain: Secondary | ICD-10-CM

## 2024-01-16 NOTE — Therapy (Addendum)
 OUTPATIENT PHYSICAL THERAPY TREATMENT NOTE/DISCHARGE  PHYSICAL THERAPY DISCHARGE SUMMARY  Visits from Start of Care: 7  Current functional level related to goals / functional outcomes: See goals/objective   Remaining deficits: Unable to assess   Education / Equipment: HEP   Patient agrees to discharge. Patient goals were unable to assess. Patient is being discharged due to not returning since the last visit.     Patient Name: Tina Bartlett MRN: [id number] DOB:05-26-1956, 68 y.o., female Today's Date: 01/16/2024  END OF SESSION:  PT End of Session - 01/16/24 1313     Visit Number 7    Number of Visits 17    Date for PT Re-Evaluation 01/21/24    Authorization Type [employer] M[employer]    Authorization Time Period Approved 12 visits 11/26/23-01/03/24( requested date extension on 6/20)    Authorization - Visit Number 5    Authorization - Number of Visits 12    PT Start Time 1308    PT Stop Time 1333    PT Time Calculation (min) 25 min              Past Medical History:  Diagnosis Date   Thyroid  disease    Past Surgical History:  Procedure Laterality Date   APPENDECTOMY     68 years old   CERVICAL FUSION  2020   C5-6 [facility]CD[facility]   JOINT REPLACEMENT Bilateral    Hips   LUMBAR SPINE SURGERY  2020   [facility] [place]   THYROIDECTOMY     Patient Active Problem List   Diagnosis Date Noted   Acute encephalopathy 07/30/2023   Overdose 07/30/2023   Daytime somnolence 11/27/2021   Dream enactment behavior 11/27/2021   Obstructive sleep apnea syndrome 11/27/2021   Acquired hypothyroidism 11/27/2021   History of lumbar laminectomy for spinal cord decompression 06/04/2021   Cervical pseudoarthrosis (HCC) 03/20/2021   Vasomotor symptoms due to menopause 08/16/2019   Hypothyroidism 08/10/2019   Hyperlipidemia 08/10/2019    PCP: Joshua Harlene CROME, NP  REFERRING PROVIDER: Addie Cordella Hamilton, MD  REFERRING DIAG: [phone] (ICD-10-CM) - Arthritis of left glenohumeral  joint   THERAPY DIAG:  Chronic left shoulder pain  Muscle weakness (generalized)  Rationale for Evaluation and Treatment: Rehabilitation  ONSET DATE: Chronic  SUBJECTIVE:                                                                                                                                                                                      SUBJECTIVE STATEMENT: Pt reports she continues to improve in her shoulders. Has some difficulty reaching straight out to the sides. Reports no pain on arrival.   EVAL: Pt presents to  with reports of chronic L shoulder pain and discomfort. Denies trauma or MOI, notes that her R shoulder used to bother her but this got better while her L shoulder has been getting worse. Previous cervical and lumbar fusions and notes very frequent N/T and burning sensation down bilateral UE, L>R. Denies bowel/bladder changes or saddle anesthesia. She was scheduled for a L reverse TSA but she wants to try therapy first and hold off on surgery if she can. She occasionally cares for her nephew and is unable to lift him without shoulder pain right now.  Hand dominance: Right  PERTINENT HISTORY: Cervical and lumbar surgeries  PAIN:  Are you having pain?  Yes: NPRS scale: 0/10 Worst: 10/10 Pain location: L shoulder, L UE Pain description: sharp, burning, N/T Aggravating factors: sweeping, vacuuming, reaching OH Relieving factors: gabapentin , icy-hot  PRECAUTIONS: None  RED FLAGS: None   WEIGHT BEARING RESTRICTIONS: No  FALLS:  Has patient fallen in last 6 months? Yes. Number of falls: one fall due to dizziness  LIVING ENVIRONMENT: Lives with: lives with their family Lives in: House/apartment Stairs: one flight of stairs Has following equipment at home: None  OCCUPATION: Retired  PLOF: Independent  PATIENT GOALS: pt wants to decrease L shoulder pain in order to be able to reach overhead and lift her nephew - wants to prevent surgery if  possible  NEXT MD VISIT: Not scheduled   OBJECTIVE:  Note: Objective measures were completed at Evaluation unless otherwise noted.  DIAGNOSTIC FINDINGS:  See imaging   PATIENT SURVEYS:  Quick DASH: 82 % disability  COGNITION: Overall cognitive status: Within functional limits for tasks assessed     SENSATION: Light touch: Impaired  C5-C6 L dermatome  POSTURE: Rounded shoulders, fwd head  UPPER EXTREMITY ROM:   Active ROM Right eval Left eval Left  12/17/23 Left 01/09/24  Shoulder flexion WFL 60 135 pain with lowering 148  Shoulder extension      Shoulder abduction WFL 50 120 135  Shoulder adduction      Shoulder internal rotation      Shoulder external rotation WFL 25    Elbow flexion      Elbow extension      Wrist flexion      Wrist extension      Wrist ulnar deviation      Wrist radial deviation      Wrist pronation      Wrist supination      (Blank rows = not tested)  UPPER EXTREMITY MMT:  MMT Right eval Left eval Left 01/16/24  Shoulder flexion 4 2+ 3+  Shoulder extension     Shoulder abduction 4 2+ 3  Shoulder adduction     Shoulder internal rotation     Shoulder external rotation 4 3   Middle trapezius     Lower trapezius     Elbow flexion     Elbow extension     Wrist flexion     Wrist extension     Wrist ulnar deviation     Wrist radial deviation     Wrist pronation     Wrist supination     Grip strength (lbs) 45lbs 40lbs   (Blank rows = not tested)  SHOULDER SPECIAL TESTS: DNT  JOINT MOBILITY TESTING:  L GH hypomobility   PALPATION:  TTP L infraspinatus  TREATMENT: [facility] Adult PT Treatment:                                                DATE: 01/16/2024 UBE lvl 1.0 x 3 min forward, 1 min reverse  for functional activity tolerance (discontinued reverse due to discomfort) Standing row 2x10 GTB Standing ext red 2 x 10   Stanidng horiz abdct Red band x 10 L shoulder ER 2x10 YTB Cabinet reach lower cabinet 5 x 2 5# using both arms OH press up holding 5 # using both arms 5 x 2  Standing forward raise x 10 AROM, trial of 1# for home weights -good tolerance Standing lateral raise x 10 AROM, trial of 1# for home weights- less rom tolerated Hip hinge for lifting- AROM (pt lifted 15 # KB x 2 )( reports MD recommended no more than 10#)   [facility] Adult PT Treatment:                                                DATE: 01/09/2024 UBE lvl 1.0 x 3 min forward, 1 min reverse  for functional activity tolerance Supine shoulder flexion 2x10 L Supine horizontal abd 2x10 YTB Supine bilateral ER 2x10 YTB Supine dow flex x 15 Supine chest press 2x10 Standing row 2x10 GTB Standing ext red 2 x 10  L shoulder ER 2x10 YTB Side lying shoulder abduction  Cabinet reach lower cabinet 2 x 10     [facility] Adult PT Treatment:                                                DATE: 12/31/2023 UBE lvl 1.0 x 3 min for functional activity tolerance Supine shoulder flexion 2x10 L Supine horizontal abd 2x10 YTB Supine bilateral ER 2x10 YTB Supine dow flex x 15 Supine chest press 2x10 Standing row 3x10 GTB L shoulder ER 2x10 YTB  [facility] Adult PT Treatment:                                                DATE: 12/29/2023 UBE lvl 1.0 x 3 min for functional activity tolerance Supine shoulder flexion 2x10 L 1# S/L shoulder abd 2x10 L S/L ER 2x10 L 1# Bilateral ER 2x10 RTB Seated horizontal abd 2x10 RTB Standing row 3x10 GTB Standing ext 2x10 GTB L shoulder ER 2x10 YTB Seated upper trap stretch x 30 L Supine pec stretch 90/90 2x30  [facility] Adult PT Treatment:                                                DATE: 12/17/23 Therapeutic Activity: Wall slides shoulder flexion Wall slides shoulder abduction Seated bilat Shoulder ER YTB 10 x 2  Seated scap retract Standing row RTB x 10 Supine shoulder flexion x 10 Side lying shoulder abduction x  10 -  disc. Due to radicular sx Side lying ER AROM x 20 Updated HEP   [facility] Adult PT Treatment:                                                DATE: 12/12/2023 UBE lvl 1.0 x 3 min for functional activity tolerance Supine shoulder flexion 2x10 L  Supine bilateral ER 2x10 RTB Supine dow flexion 2x10  Seated row 2x10 RTB L shoulder ER isometric x 5 - 5 hold Manual Therapy:  STM to L upper trap Positional release to L upper trap Suboccipital release  [facility] Adult PT Treatment:                                                DATE: 11/26/2023 Therapeutic Exercise: Seated scapular retraction x 5 - 5 hold Seated row x 5 RTB L shoulder ER isometric x 5 - 5 hold  PATIENT EDUCATION: Education details: HEP update Person educated: Patient Education method: Explanation, Demonstration, and Handouts Education comprehension: verbalized understanding and returned demonstration  HOME EXERCISE PROGRAM: Access Code: [email or web address] URL: [email or web address] Date: 12/29/2023 Prepared by: Alm Kingdom  Exercises - Seated Scapular Retraction  - 1 x daily - 7 x weekly - 2 sets - 10 reps - 3 sec hold - Standing Shoulder Row with Anchored Resistance  - 1 x daily - 7 x weekly - 3 sets - 10 reps - red band hold - Standing Isometric Shoulder External Rotation with Doorway  - 1 x daily - 7 x weekly - 2 sets - 10 reps - 5 sec hold - Supine Shoulder Flexion Extension Full Range AROM  - 1 x daily - 7 x weekly - 2-3 sets - 10 reps - Shoulder Flexion Wall Slide with Towel  - 1 x daily - 7 x weekly - 2 sets - 10 reps - Standing Shoulder Abduction Wall Slide with Thumb Out  - 1 x daily - 7 x weekly - 2 sets - 10 reps - Standing Shoulder External Rotation with Resistance  - 1 x daily - 7 x weekly - 1-2 sets - 10 reps Added 01/16/24 - Standing Shoulder Horizontal Abduction with Resistance  - 1 x daily - 7 x weekly - 1-2 sets - 10 reps - Seated Overhead Press with Dumbbells  - 1 x daily - 7 x weekly - 1-2 sets -  10 reps  ASSESSMENT:  CLINICAL IMPRESSION: Pt was able to complete all prescribed exercises with continued progression of functional ROM and RTC strength. We also focused on improving reach and lifting tasks. Pt demonstrates improved shoulder strength, lifting 5# into cabinet and overhead without increased pain. Reports 0/10 pain on arrival and less intensity of overall pain. She did have some difficulty with reverse arm bike and lateral raises.  Pt continues to demo weakness and decreased function of L shoulder for repetitive tasks and lifting and carrying. She has a goal to lift 25# however also may have a lifting restriction of 10# per her report. She was able to return demo hip hinge for squat lifting and would like to continue reinforcing her body mechanics with light weight. She is very consistent with HEP and it was updated today. She will return in 2 weeks  for re-eval.   EVAL: Patient is a 68 y.o. F who was seen today for physical therapy evaluation and treatment for chronic L shoulder pain. Physical findings are consistent with MD impression as pt demonstrates decrease in L shoulder strength and AROM. Quick DASH score indicates severe disability for performance of home ADLs and community activities. Pt would benefit from skilled PT services working on improving strength and ROM in order to decrease pain and improve shoulder function.     OBJECTIVE IMPAIRMENTS: decreased activity tolerance, decreased mobility, difficulty walking, decreased ROM, decreased strength, impaired sensation, impaired UE functional use, postural dysfunction, and pain  ACTIVITY LIMITATIONS: carrying, lifting, standing, squatting, stairs, reach over head, and hygiene/grooming  PARTICIPATION LIMITATIONS: meal prep, cleaning, driving, shopping, community activity, yard work, and school  PERSONAL FACTORS: Time since onset of injury/illness/exacerbation and 1-2 comorbidities: Cervical and lumbar surgeries are also affecting  patient's functional outcome.   REHAB POTENTIAL: Good - may be limited due to chronicity of shoulder pain  CLINICAL DECISION MAKING: Evolving/moderate complexity  EVALUATION COMPLEXITY: Moderate   GOALS: Goals reviewed with patient? No  SHORT TERM GOALS: Target date: 12/17/2023   Pt will be compliant and knowledgeable with initial HEP for improved comfort and carryover Baseline: initial HEP given  Goal status: MET  2.  Pt will self report left shoulder pain no greater than 7/10 for improved comfort and functional ability Baseline: 10/10 at worst Goal status: MET   LONG TERM GOALS: Target date: 01/21/2024   Pt will decrease Quick DASH disability score to no greater than 65% as proxy for functional improvement Baseline: 82% disability  Goal status: ONGOING  2.  Pt will self report left shoulder pain no greater than 3/10 for improved comfort and functional ability Baseline: 10/10 at worst 01/16/24: 6/10 Goal status: ONGOING   3.  Pt will improve L shoulder flexion to no less than 130 degrees for improved functional ability with ADL performance and OH reaching Baseline: 60 degrees Goal status: MET  4.  Pt will improve L shoulder MMT to no less than 3+/5 for all tested motions for improved comfort and dynamic stabliity Baseline:  Goal status: PARTIALLY MET  5.  Pt will be able to lift 25lb KB with no increase in L shoulder pain for improved ability to lift her nephew Baseline: unable  Goal status: ONGOING (MAY HAVE A 10# lifting restriction???)  PLAN:  PT FREQUENCY: 2x/week  PT DURATION: 8 weeks  PLANNED INTERVENTIONS: 97164- PT Re-evaluation, 97110-Therapeutic exercises, 97530- Therapeutic activity, [id number]- Neuromuscular re-education, 97535- Self Care, [id number]- Manual therapy, [id number]- Gait training, [phone]- Electrical stimulation (unattended), [id number]- Electrical stimulation (manual), 97016- Vasopneumatic device, Dry Needling, Cryotherapy, and Moist heat  PLAN FOR NEXT SESSION:  assess HEP response, RTC and periscapular strengthening, supine shoulder flexion progression  Referring diagnosis? M19.012 (ICD-10-CM) - Arthritis of left glenohumeral joint  Treatment diagnosis? (if different than referring diagnosis)  Chronic left shoulder pain Muscle weakness (generalized) Abnormal posture Cervicalgia  What was this (referring dx) caused by? []  Surgery []  Fall []  Ongoing issue [x]  Arthritis []  Other: ____________  Laterality: []  Rt [x]  Lt []  Both  Check all possible CPT codes:  *CHOOSE 10 OR LESS*    See Planned Interventions listed in the Plan section of the Evaluation.    Harlene Persons, PTA 01/16/24 2:02 PM Phone: [phone] Fax: [phone]

## 2024-02-09 ENCOUNTER — Ambulatory Visit: Payer: Self-pay | Attending: Orthopedic Surgery

## 2024-02-09 ENCOUNTER — Telehealth: Payer: Self-pay

## 2024-02-09 NOTE — Therapy (Deleted)
 OUTPATIENT PHYSICAL THERAPY TREATMENT   Patient Name: Tina Bartlett MRN: [id number] DOB:May 11, 1956, 68 y.o., female Today's Date: 02/09/2024  END OF SESSION:        Past Medical History:  Diagnosis Date   Thyroid  disease    Past Surgical History:  Procedure Laterality Date   APPENDECTOMY     69 years old   CERVICAL FUSION  2020   C5-6 ACDF   JOINT REPLACEMENT Bilateral    Hips   LUMBAR SPINE SURGERY  2020   [facility] [place]   THYROIDECTOMY     Patient Active Problem List   Diagnosis Date Noted   Acute encephalopathy 07/30/2023   Overdose 07/30/2023   Daytime somnolence 11/27/2021   Dream enactment behavior 11/27/2021   Obstructive sleep apnea syndrome 11/27/2021   Acquired hypothyroidism 11/27/2021   History of lumbar laminectomy for spinal cord decompression 06/04/2021   Cervical pseudoarthrosis (HCC) 03/20/2021   Vasomotor symptoms due to menopause 08/16/2019   Hypothyroidism 08/10/2019   Hyperlipidemia 08/10/2019    PCP: Joshua Harlene CROME, NP  REFERRING PROVIDER: Addie Cordella Hamilton, MD  REFERRING DIAG: [id number] (ICD-10-CM) - Arthritis of left glenohumeral joint   THERAPY DIAG:  No diagnosis found.  Rationale for Evaluation and Treatment: Rehabilitation  ONSET DATE: Chronic  SUBJECTIVE:                                                                                                                                                                                      SUBJECTIVE STATEMENT: ***  EVAL: Pt presents to with reports of chronic L shoulder pain and discomfort. Denies trauma or MOI, notes that her R shoulder used to bother her but this got better while her L shoulder has been getting worse. Previous cervical and lumbar fusions and notes very frequent N/T and burning sensation down bilateral UE, L>R. Denies bowel/bladder changes or saddle anesthesia. She was scheduled for a L reverse TSA but she wants to try therapy first and hold off  on surgery if she can. She occasionally cares for her nephew and is unable to lift him without shoulder pain right now.  Hand dominance: Right  PERTINENT HISTORY: Cervical and lumbar surgeries  PAIN:  Are you having pain?  Yes: NPRS scale: 0/10 Worst: 10/10 Pain location: L shoulder, L UE Pain description: sharp, burning, N/T Aggravating factors: sweeping, vacuuming, reaching OH Relieving factors: gabapentin , icy-hot  PRECAUTIONS: None  RED FLAGS: None   WEIGHT BEARING RESTRICTIONS: No  FALLS:  Has patient fallen in last 6 months? Yes. Number of falls: one fall due to dizziness  LIVING ENVIRONMENT: Lives with: lives with their family Lives in: House/apartment Stairs: one flight  of stairs Has following equipment at home: None  OCCUPATION: Retired  PLOF: Independent  PATIENT GOALS: pt wants to decrease L shoulder pain in order to be able to reach overhead and lift her nephew - wants to prevent surgery if possible  NEXT MD VISIT: Not scheduled   OBJECTIVE:  Note: Objective measures were completed at Evaluation unless otherwise noted.  DIAGNOSTIC FINDINGS:  See imaging   PATIENT SURVEYS:  Quick DASH: 82 % disability  COGNITION: Overall cognitive status: Within functional limits for tasks assessed     SENSATION: Light touch: Impaired  C5-C6 L dermatome  POSTURE: Rounded shoulders, fwd head  UPPER EXTREMITY ROM:   Active ROM Right eval Left eval Left  12/17/23 Left 01/09/24  Shoulder flexion WFL 60 135 pain with lowering 148  Shoulder extension      Shoulder abduction WFL 50 120 135  Shoulder adduction      Shoulder internal rotation      Shoulder external rotation WFL 25    Elbow flexion      Elbow extension      Wrist flexion      Wrist extension      Wrist ulnar deviation      Wrist radial deviation      Wrist pronation      Wrist supination      (Blank rows = not tested)  UPPER EXTREMITY MMT:  MMT Right eval Left eval Left 01/16/24   Shoulder flexion 4 2+ 3+  Shoulder extension     Shoulder abduction 4 2+ 3  Shoulder adduction     Shoulder internal rotation     Shoulder external rotation 4 3   Middle trapezius     Lower trapezius     Elbow flexion     Elbow extension     Wrist flexion     Wrist extension     Wrist ulnar deviation     Wrist radial deviation     Wrist pronation     Wrist supination     Grip strength (lbs) 45lbs 40lbs   (Blank rows = not tested)  SHOULDER SPECIAL TESTS: DNT  JOINT MOBILITY TESTING:  L GH hypomobility   PALPATION:  TTP L infraspinatus                                                                                                                      TREATMENT: [facility] Adult PT Treatment:                                                DATE: 01/16/2024 UBE lvl 1.0 x 3 min forward, 1 min reverse  for functional activity tolerance (discontinued reverse due to discomfort) Standing row 2x10 GTB Standing ext red 2 x 10  Stanidng horiz abdct Red band x 10 L shoulder ER 2x10 YTB Cabinet reach lower cabinet  5 x 2 5# using both arms OH press up holding 5 # using both arms 5 x 2  Standing forward raise x 10 AROM, trial of 1# for home weights -good tolerance Standing lateral raise x 10 AROM, trial of 1# for home weights- less rom tolerated Hip hinge for lifting- AROM (pt lifted 15 # KB x 2 )( reports MD recommended no more than 10#)   [facility] Adult PT Treatment:                                                DATE: 01/09/2024 UBE lvl 1.0 x 3 min forward, 1 min reverse  for functional activity tolerance Supine shoulder flexion 2x10 L Supine horizontal abd 2x10 YTB Supine bilateral ER 2x10 YTB Supine dow flex x 15 Supine chest press 2x10 Standing row 2x10 GTB Standing ext red 2 x 10  L shoulder ER 2x10 YTB Side lying shoulder abduction  Cabinet reach lower cabinet 2 x 10     [facility] Adult PT Treatment:                                                DATE: 12/31/2023 UBE lvl 1.0 x 3 min  for functional activity tolerance Supine shoulder flexion 2x10 L Supine horizontal abd 2x10 YTB Supine bilateral ER 2x10 YTB Supine dow flex x 15 Supine chest press 2x10 Standing row 3x10 GTB L shoulder ER 2x10 YTB  [facility] Adult PT Treatment:                                                DATE: 12/29/2023 UBE lvl 1.0 x 3 min for functional activity tolerance Supine shoulder flexion 2x10 L 1# S/L shoulder abd 2x10 L S/L ER 2x10 L 1# Bilateral ER 2x10 RTB Seated horizontal abd 2x10 RTB Standing row 3x10 GTB Standing ext 2x10 GTB L shoulder ER 2x10 YTB Seated upper trap stretch x 30 L Supine pec stretch 90/90 2x30  [facility] Adult PT Treatment:                                                DATE: 12/17/23 Therapeutic Activity: Wall slides shoulder flexion Wall slides shoulder abduction Seated bilat Shoulder ER YTB 10 x 2  Seated scap retract Standing row RTB x 10 Supine shoulder flexion x 10 Side lying shoulder abduction x 10 -disc. Due to radicular sx Side lying ER AROM x 20 Updated HEP   [facility] Adult PT Treatment:                                                DATE: 12/12/2023 UBE lvl 1.0 x 3 min for functional activity tolerance Supine shoulder flexion 2x10 L  Supine bilateral ER 2x10 RTB Supine dow flexion 2x10  Seated row 2x10 RTB L shoulder ER isometric  x 5 - 5 hold Manual Therapy:  STM to L upper trap Positional release to L upper trap Suboccipital release  [facility] Adult PT Treatment:                                                DATE: 11/26/2023 Therapeutic Exercise: Seated scapular retraction x 5 - 5 hold Seated row x 5 RTB L shoulder ER isometric x 5 - 5 hold  PATIENT EDUCATION: Education details: HEP update Person educated: Patient Education method: Explanation, Demonstration, and Handouts Education comprehension: verbalized understanding and returned demonstration  HOME EXERCISE PROGRAM: Access Code: [email or web address] URL: [email or web address] Date:  12/29/2023 Prepared by: Alm Kingdom  Exercises - Seated Scapular Retraction  - 1 x daily - 7 x weekly - 2 sets - 10 reps - 3 sec hold - Standing Shoulder Row with Anchored Resistance  - 1 x daily - 7 x weekly - 3 sets - 10 reps - red band hold - Standing Isometric Shoulder External Rotation with Doorway  - 1 x daily - 7 x weekly - 2 sets - 10 reps - 5 sec hold - Supine Shoulder Flexion Extension Full Range AROM  - 1 x daily - 7 x weekly - 2-3 sets - 10 reps - Shoulder Flexion Wall Slide with Towel  - 1 x daily - 7 x weekly - 2 sets - 10 reps - Standing Shoulder Abduction Wall Slide with Thumb Out  - 1 x daily - 7 x weekly - 2 sets - 10 reps - Standing Shoulder External Rotation with Resistance  - 1 x daily - 7 x weekly - 1-2 sets - 10 reps Added 01/16/24 - Standing Shoulder Horizontal Abduction with Resistance  - 1 x daily - 7 x weekly - 1-2 sets - 10 reps - Seated Overhead Press with Dumbbells  - 1 x daily - 7 x weekly - 1-2 sets - 10 reps  ASSESSMENT:  CLINICAL IMPRESSION: ***  Pt was able to complete all prescribed exercises with continued progression of functional ROM and RTC strength. We also focused on improving reach and lifting tasks. Pt demonstrates improved shoulder strength, lifting 5# into cabinet and overhead without increased pain. Reports 0/10 pain on arrival and less intensity of overall pain. She did have some difficulty with reverse arm bike and lateral raises.  Pt continues to demo weakness and decreased function of L shoulder for repetitive tasks and lifting and carrying. She has a goal to lift 25# however also may have a lifting restriction of 10# per her report. She was able to return demo hip hinge for squat lifting and would like to continue reinforcing her body mechanics with light weight. She is very consistent with HEP and it was updated today. She will return in 2 weeks for re-eval.   EVAL: Patient is a 68 y.o. F who was seen today for physical therapy evaluation and  treatment for chronic L shoulder pain. Physical findings are consistent with MD impression as pt demonstrates decrease in L shoulder strength and AROM. Quick DASH score indicates severe disability for performance of home ADLs and community activities. Pt would benefit from skilled PT services working on improving strength and ROM in order to decrease pain and improve shoulder function.     OBJECTIVE IMPAIRMENTS: decreased activity tolerance, decreased mobility, difficulty walking, decreased ROM, decreased strength, impaired sensation, impaired  UE functional use, postural dysfunction, and pain  ACTIVITY LIMITATIONS: carrying, lifting, standing, squatting, stairs, reach over head, and hygiene/grooming  PARTICIPATION LIMITATIONS: meal prep, cleaning, driving, shopping, community activity, yard work, and school  PERSONAL FACTORS: Time since onset of injury/illness/exacerbation and 1-2 comorbidities: Cervical and lumbar surgeries are also affecting patient's functional outcome.   REHAB POTENTIAL: Good - may be limited due to chronicity of shoulder pain  CLINICAL DECISION MAKING: Evolving/moderate complexity  EVALUATION COMPLEXITY: Moderate   GOALS: Goals reviewed with patient? No  SHORT TERM GOALS: Target date: 12/17/2023   Pt will be compliant and knowledgeable with initial HEP for improved comfort and carryover Baseline: initial HEP given  Goal status: MET  2.  Pt will self report left shoulder pain no greater than 7/10 for improved comfort and functional ability Baseline: 10/10 at worst Goal status: MET   LONG TERM GOALS: Target date: 01/21/2024   Pt will decrease Quick DASH disability score to no greater than 65% as proxy for functional improvement Baseline: 82% disability  Goal status: ONGOING  2.  Pt will self report left shoulder pain no greater than 3/10 for improved comfort and functional ability Baseline: 10/10 at worst 01/16/24: 6/10 Goal status: ONGOING   3.  Pt will  improve L shoulder flexion to no less than 130 degrees for improved functional ability with ADL performance and OH reaching Baseline: 60 degrees Goal status: MET  4.  Pt will improve L shoulder MMT to no less than 3+/5 for all tested motions for improved comfort and dynamic stabliity Baseline:  Goal status: PARTIALLY MET  5.  Pt will be able to lift 25lb KB with no increase in L shoulder pain for improved ability to lift her nephew Baseline: unable  Goal status: ONGOING (MAY HAVE A 10# lifting restriction???)  PLAN:  PT FREQUENCY: 2x/week  PT DURATION: 8 weeks  PLANNED INTERVENTIONS: 97164- PT Re-evaluation, 97110-Therapeutic exercises, 97530- Therapeutic activity, [id number]- Neuromuscular re-education, 97535- Self Care, [id number]- Manual therapy, [id number]- Gait training, [phone]- Electrical stimulation (unattended), [id number]- Electrical stimulation (manual), 97016- Vasopneumatic device, Dry Needling, Cryotherapy, and Moist heat  PLAN FOR NEXT SESSION: assess HEP response, RTC and periscapular strengthening, supine shoulder flexion progression  Referring diagnosis? M19.012 (ICD-10-CM) - Arthritis of left glenohumeral joint  Treatment diagnosis? (if different than referring diagnosis)  Chronic left shoulder pain Muscle weakness (generalized) Abnormal posture Cervicalgia  What was this (referring dx) caused by? []  Surgery []  Fall []  Ongoing issue [x]  Arthritis []  Other: ____________  Laterality: []  Rt [x]  Lt []  Both  Check all possible CPT codes:  *CHOOSE 10 OR LESS*    See Planned Interventions listed in the Plan section of the Evaluation.    Alm JAYSON Kingdom PT  02/09/24 9:55 AM

## 2024-02-09 NOTE — Telephone Encounter (Signed)
 PT called and spoke with patient regarding missed visit on 02/09/2024. She stated she was not feeling well after traveling the previous weekend. Her left shoulder is feeling much better now though and she feels appropriate for PT discharge at this time.   Alm JAYSON Kingdom PT, DPT 02/09/24 1:56 PM

## 2024-02-20 ENCOUNTER — Encounter: Payer: Self-pay | Admitting: Physical Therapy

## 2024-05-24 ENCOUNTER — Encounter: Payer: Self-pay | Admitting: Radiology

## 2024-08-02 ENCOUNTER — Encounter: Payer: Self-pay | Admitting: *Deleted

## 2024-08-02 ENCOUNTER — Ambulatory Visit (INDEPENDENT_AMBULATORY_CARE_PROVIDER_SITE_OTHER)

## 2024-08-02 ENCOUNTER — Ambulatory Visit: Admission: EM | Admit: 2024-08-02 | Discharge: 2024-08-02 | Disposition: A | Discharge status: Home / Self Care

## 2024-08-02 DIAGNOSIS — R051 Acute cough: Secondary | ICD-10-CM

## 2024-08-02 MED ORDER — ALBUTEROL SULFATE HFA 108 (90 BASE) MCG/ACT IN AERS: 2.0000 | INHALATION_SPRAY | Freq: Four times a day (QID) | RESPIRATORY_TRACT | 2 refills | Status: AC | PRN

## 2024-08-02 MED ORDER — BENZONATATE 200 MG PO CAPS: 200.0000 mg | ORAL_CAPSULE | Freq: Three times a day (TID) | ORAL | 0 refills | Status: AC | PRN

## 2024-08-02 MED ORDER — DOXYCYCLINE HYCLATE 100 MG PO CAPS: 100.0000 mg | ORAL_CAPSULE | Freq: Two times a day (BID) | ORAL | 0 refills | Status: AC

## 2024-08-02 NOTE — ED Triage Notes (Signed)
 Pt reports 1 week of cough, hot/cold and body aches. States she had a fever on Friday which is better. Now has congested cough that is productive sometimes clear and sometimes yellow. States it feels like when I had bronchitis. Using OTC meds and tea for her symptoms.

## 2024-08-02 NOTE — Discharge Instructions (Addendum)
 Return if any problems.

## 2024-08-05 NOTE — ED Provider Notes (Signed)
 " [facility]    CSN: [id number] Arrival date & time: 08/02/24  9180      History   Chief Complaint Chief Complaint  Patient presents with   Cough    HPI Tina Bartlett is a 69 y.o. female.   Patient complains of feeling like she has bronchitis.  Patient reports that she has had a cough and congestion.  Patient reports symptoms began 1 week ago she is now coughing up yellow phlegm.  Patient reports no improvement with over-the-counter medications.  Patient states she has been on antibiotics in the past when she has had bronchitis.  The history is provided by the patient. No language interpreter was used.  Cough   Past Medical History:  Diagnosis Date   Thyroid  disease     Patient Active Problem List   Diagnosis Date Noted   Acute encephalopathy 07/30/2023   Overdose 07/30/2023   Daytime somnolence 11/27/2021   Dream enactment behavior 11/27/2021   Obstructive sleep apnea syndrome 11/27/2021   Acquired hypothyroidism 11/27/2021   History of lumbar laminectomy for spinal cord decompression 06/04/2021   Cervical pseudoarthrosis (HCC) 03/20/2021   Vasomotor symptoms due to menopause 08/16/2019   Hypothyroidism 08/10/2019   Hyperlipidemia 08/10/2019    Past Surgical History:  Procedure Laterality Date   APPENDECTOMY     69 years old   CERVICAL FUSION  2020   C5-6 ACDF   JOINT REPLACEMENT Bilateral    Hips   LUMBAR SPINE SURGERY  2020   [facility] [place]   THYROIDECTOMY      OB History   No obstetric history on file.      Home Medications    Prior to Admission medications  Medication Sig Start Date End Date Taking? Authorizing Provider  albuterol  (VENTOLIN  HFA) 108 (90 Base) MCG/ACT inhaler Inhale 2 puffs into the lungs every 6 (six) hours as needed for wheezing or shortness of breath. 08/02/24  Yes Mackenzi Krogh K, PA-C  benzonatate  (TESSALON ) 200 MG capsule Take 1 capsule (200 mg total) by mouth 3 (three) times daily as needed for  cough. 08/02/24  Yes Durenda Pechacek K, PA-C  doxycycline  (VIBRAMYCIN ) 100 MG capsule Take 1 capsule (100 mg total) by mouth 2 (two) times daily. 08/02/24  Yes Paolo Okane K, PA-C  gabapentin  (NEURONTIN ) 600 MG tablet Take 1 tablet (600 mg total) by mouth 3 (three) times daily. Reach out to your PCP for any refills 09/01/23  Yes Magnant, Carlin CROME, PA-C  levothyroxine  (SYNTHROID ) 125 MCG tablet Take 125 mcg by mouth every morning. 07/10/23  Yes [provider]  lubiprostone  (AMITIZA ) 24 MCG capsule Take 24 mcg by mouth daily.   Yes [provider]  Multiple Vitamins-Minerals (CENTRUM WOMEN) TABS Take 1 tablet by mouth daily at 2 am.   Yes [provider]  pantoprazole  (PROTONIX ) 40 MG tablet Take 40 mg by mouth daily. 06/24/23  Yes [provider]  simvastatin  (ZOCOR ) 40 MG tablet Take 1 tablet (40 mg total) by mouth daily. 04/22/22  Yes Tanda Bleacher, MD  baclofen  (LIORESAL ) 20 MG tablet Take 20 mg by mouth 2 (two) times daily. Patient not taking: Reported on 08/02/2024    [provider]  diazepam  (VALIUM ) 2 MG tablet Take 1 tablet (2 mg total) by mouth at bedtime as needed for anxiety. Patient not taking: Reported on 08/02/2024 09/26/23   Magnant, Carlin CROME, PA-C  ibuprofen  (ADVIL ) 800 MG tablet TAKE 1 TABLET BY MOUTH EVERY 8 HOURS AS NEEDED Patient  not taking: Reported on 08/02/2024 06/24/23   Magnant, Carlin CROME, PA-C  Multiple Vitamins-Minerals (MULTIVITAMIN WITH IRON-MINERALS) liquid See admin instructions. Patient not taking: Reported on 08/02/2024    [provider]    Family History Family History  Problem Relation Age of Onset   Lung cancer Mother     Social History Social History[1]   Allergies   Hydroxyzine , Nsaids, Tramadol , and Tylenol  [acetaminophen ]   Review of Systems Review of Systems  Respiratory:  Positive for cough.   All other systems reviewed and are negative.    Physical Exam Triage Vital Signs ED Triage  Vitals  Encounter Vitals Group     BP 08/02/24 0941 (!) 146/90     Girls Systolic BP Percentile --      Girls Diastolic BP Percentile --      Boys Systolic BP Percentile --      Boys Diastolic BP Percentile --      Pulse Rate 08/02/24 0941 (!) 49     Resp 08/02/24 0941 18     Temp 08/02/24 0941 98.4 F (36.9 C)     Temp Source 08/02/24 0941 Oral     SpO2 08/02/24 0941 97 %     Weight --      Height --      Head Circumference --      Peak Flow --      Pain Score 08/02/24 0936 10     Pain Loc --      Pain Education --      Exclude from Growth Chart --    No data found.  Updated Vital Signs BP (!) 146/90 (BP Location: Right Arm)   Pulse (!) 49   Temp 98.4 F (36.9 C) (Oral)   Resp 18   SpO2 97%   Visual Acuity Right Eye Distance:   Left Eye Distance:   Bilateral Distance:    Right Eye Near:   Left Eye Near:    Bilateral Near:     Physical Exam Vitals and nursing note reviewed.  Constitutional:      Appearance: She is well-developed.  HENT:     Head: Normocephalic.     Right Ear: Tympanic membrane normal.     Left Ear: Tympanic membrane normal.     Mouth/Throat:     Mouth: Mucous membranes are moist.  Cardiovascular:     Rate and Rhythm: Normal rate.  Pulmonary:     Effort: Pulmonary effort is normal.  Abdominal:     General: There is no distension.  Musculoskeletal:        General: Normal range of motion.     Cervical back: Normal range of motion.  Skin:    General: Skin is warm.  Neurological:     General: No focal deficit present.     Mental Status: She is alert and oriented to person, place, and time.      UC Treatments / Results  Labs (all labs ordered are listed, but only abnormal results are displayed) Labs Reviewed - No data to display  EKG   Radiology No results found.  Procedures Procedures (including critical care time)  Medications Ordered in UC Medications - No data to display  Initial Impression / Assessment and Plan / UC  Course  I have reviewed the triage vital signs and the nursing notes.  Pertinent labs & imaging results that were available during my care of the patient were reviewed by me and considered in my medical decision making (see  chart for details).     Pt given rx for doxycycline  albuterol  and Tessalon  [place].  Patient is advised to follow-up with her primary care physician for recheck.  Patient is advised to return if symptoms worsen or change Final Clinical Impressions(s) / UC Diagnoses   Final diagnoses:  Acute cough     Discharge Instructions      Return if any problems.     ED Prescriptions     Medication Sig Dispense Auth. Provider   doxycycline  (VIBRAMYCIN ) 100 MG capsule Take 1 capsule (100 mg total) by mouth 2 (two) times daily. 20 capsule Zoya Sprecher K, PA-C   albuterol  (VENTOLIN  HFA) 108 (90 Base) MCG/ACT inhaler Inhale 2 puffs into the lungs every 6 (six) hours as needed for wheezing or shortness of breath. 8 g Marcelis Wissner K, PA-C   benzonatate  (TESSALON ) 200 MG capsule Take 1 capsule (200 mg total) by mouth 3 (three) times daily as needed for cough. 20 capsule Rorey Hodges K, PA-C      PDMP not reviewed this encounter. An After Visit Summary was printed and given to the patient.        [1]  Social History Tobacco Use   Smoking status: Some Days    Current packs/day: 0.50    Average packs/day: 0.5 packs/day for 38.0 years (19.0 ttl pk-yrs)    Types: Cigarettes   Smokeless tobacco: Never  Vaping Use   Vaping status: Never Used  Substance Use Topics   Alcohol use: Yes    Comment: socially   Drug use: Not Currently     Flint Sonny POUR, PA-C 08/05/24 1401  "
